# Patient Record
Sex: Female | Born: 1935 | Race: Black or African American | Hispanic: No | Marital: Single | Smoking: Never smoker
Health system: Southern US, Community
[De-identification: ages and names within clinical notes are randomized; demographics above are authoritative.]

## PROBLEM LIST (undated history)

## (undated) DIAGNOSIS — E119 Type 2 diabetes mellitus without complications: Secondary | ICD-10-CM

## (undated) DIAGNOSIS — K5909 Other constipation: Secondary | ICD-10-CM

## (undated) DIAGNOSIS — I509 Heart failure, unspecified: Secondary | ICD-10-CM

## (undated) DIAGNOSIS — M1990 Unspecified osteoarthritis, unspecified site: Secondary | ICD-10-CM

## (undated) DIAGNOSIS — E785 Hyperlipidemia, unspecified: Secondary | ICD-10-CM

## (undated) DIAGNOSIS — Z853 Personal history of malignant neoplasm of breast: Secondary | ICD-10-CM

## (undated) DIAGNOSIS — I10 Essential (primary) hypertension: Secondary | ICD-10-CM

## (undated) DIAGNOSIS — I480 Paroxysmal atrial fibrillation: Secondary | ICD-10-CM

## (undated) DIAGNOSIS — T7840XA Allergy, unspecified, initial encounter: Secondary | ICD-10-CM

## (undated) DIAGNOSIS — I442 Atrioventricular block, complete: Secondary | ICD-10-CM

## (undated) HISTORY — DX: Paroxysmal atrial fibrillation: I48.0

## (undated) HISTORY — DX: Essential (primary) hypertension: I10

## (undated) HISTORY — DX: Type 2 diabetes mellitus without complications: E11.9

## (undated) HISTORY — DX: Heart failure, unspecified: I50.9

## (undated) HISTORY — DX: Other constipation: K59.09

## (undated) HISTORY — PX: BREAST SURGERY: SHX581

## (undated) HISTORY — DX: Unspecified osteoarthritis, unspecified site: M19.90

## (undated) HISTORY — DX: Hyperlipidemia, unspecified: E78.5

## (undated) HISTORY — DX: Allergy, unspecified, initial encounter: T78.40XA

## (undated) HISTORY — DX: Personal history of malignant neoplasm of breast: Z85.3

---

## 2011-10-15 DIAGNOSIS — M2010 Hallux valgus (acquired), unspecified foot: Secondary | ICD-10-CM | POA: Diagnosis not present

## 2011-10-15 DIAGNOSIS — M79609 Pain in unspecified limb: Secondary | ICD-10-CM | POA: Diagnosis not present

## 2011-10-15 DIAGNOSIS — M2040 Other hammer toe(s) (acquired), unspecified foot: Secondary | ICD-10-CM | POA: Diagnosis not present

## 2011-10-29 DIAGNOSIS — E1159 Type 2 diabetes mellitus with other circulatory complications: Secondary | ICD-10-CM | POA: Diagnosis not present

## 2011-10-29 DIAGNOSIS — B351 Tinea unguium: Secondary | ICD-10-CM | POA: Diagnosis not present

## 2011-10-29 DIAGNOSIS — M2040 Other hammer toe(s) (acquired), unspecified foot: Secondary | ICD-10-CM | POA: Diagnosis not present

## 2011-10-29 DIAGNOSIS — L84 Corns and callosities: Secondary | ICD-10-CM | POA: Diagnosis not present

## 2011-12-19 DIAGNOSIS — N182 Chronic kidney disease, stage 2 (mild): Secondary | ICD-10-CM | POA: Diagnosis not present

## 2011-12-19 DIAGNOSIS — N058 Unspecified nephritic syndrome with other morphologic changes: Secondary | ICD-10-CM | POA: Diagnosis not present

## 2011-12-19 DIAGNOSIS — E1129 Type 2 diabetes mellitus with other diabetic kidney complication: Secondary | ICD-10-CM | POA: Diagnosis not present

## 2011-12-19 DIAGNOSIS — E1165 Type 2 diabetes mellitus with hyperglycemia: Secondary | ICD-10-CM | POA: Diagnosis not present

## 2011-12-25 DIAGNOSIS — I10 Essential (primary) hypertension: Secondary | ICD-10-CM | POA: Diagnosis not present

## 2011-12-27 DIAGNOSIS — H04129 Dry eye syndrome of unspecified lacrimal gland: Secondary | ICD-10-CM | POA: Diagnosis not present

## 2011-12-27 DIAGNOSIS — H01029 Squamous blepharitis unspecified eye, unspecified eyelid: Secondary | ICD-10-CM | POA: Diagnosis not present

## 2012-01-18 DIAGNOSIS — H6120 Impacted cerumen, unspecified ear: Secondary | ICD-10-CM | POA: Diagnosis not present

## 2012-01-18 DIAGNOSIS — J310 Chronic rhinitis: Secondary | ICD-10-CM | POA: Diagnosis not present

## 2012-02-27 DIAGNOSIS — B351 Tinea unguium: Secondary | ICD-10-CM | POA: Diagnosis not present

## 2012-02-27 DIAGNOSIS — E1159 Type 2 diabetes mellitus with other circulatory complications: Secondary | ICD-10-CM | POA: Diagnosis not present

## 2012-02-27 DIAGNOSIS — L84 Corns and callosities: Secondary | ICD-10-CM | POA: Diagnosis not present

## 2012-02-27 DIAGNOSIS — M2040 Other hammer toe(s) (acquired), unspecified foot: Secondary | ICD-10-CM | POA: Diagnosis not present

## 2012-05-27 DIAGNOSIS — H538 Other visual disturbances: Secondary | ICD-10-CM | POA: Diagnosis not present

## 2012-05-27 DIAGNOSIS — H2510 Age-related nuclear cataract, unspecified eye: Secondary | ICD-10-CM | POA: Diagnosis not present

## 2012-05-27 DIAGNOSIS — H04129 Dry eye syndrome of unspecified lacrimal gland: Secondary | ICD-10-CM | POA: Diagnosis not present

## 2012-05-27 DIAGNOSIS — H52229 Regular astigmatism, unspecified eye: Secondary | ICD-10-CM | POA: Diagnosis not present

## 2012-05-27 DIAGNOSIS — E119 Type 2 diabetes mellitus without complications: Secondary | ICD-10-CM | POA: Diagnosis not present

## 2012-06-13 DIAGNOSIS — M1990 Unspecified osteoarthritis, unspecified site: Secondary | ICD-10-CM | POA: Diagnosis not present

## 2012-06-13 DIAGNOSIS — J40 Bronchitis, not specified as acute or chronic: Secondary | ICD-10-CM | POA: Diagnosis not present

## 2012-06-13 DIAGNOSIS — Z79899 Other long term (current) drug therapy: Secondary | ICD-10-CM | POA: Diagnosis not present

## 2012-06-13 DIAGNOSIS — E7800 Pure hypercholesterolemia, unspecified: Secondary | ICD-10-CM | POA: Diagnosis not present

## 2012-06-13 DIAGNOSIS — I10 Essential (primary) hypertension: Secondary | ICD-10-CM | POA: Diagnosis not present

## 2012-06-25 DIAGNOSIS — M2010 Hallux valgus (acquired), unspecified foot: Secondary | ICD-10-CM | POA: Diagnosis not present

## 2012-06-25 DIAGNOSIS — L608 Other nail disorders: Secondary | ICD-10-CM | POA: Diagnosis not present

## 2012-06-25 DIAGNOSIS — L84 Corns and callosities: Secondary | ICD-10-CM | POA: Diagnosis not present

## 2012-06-25 DIAGNOSIS — E119 Type 2 diabetes mellitus without complications: Secondary | ICD-10-CM | POA: Diagnosis not present

## 2012-06-27 DIAGNOSIS — M2550 Pain in unspecified joint: Secondary | ICD-10-CM | POA: Diagnosis not present

## 2012-06-27 DIAGNOSIS — Z23 Encounter for immunization: Secondary | ICD-10-CM | POA: Diagnosis not present

## 2012-07-01 ENCOUNTER — Other Ambulatory Visit: Payer: Self-pay | Admitting: Family Medicine

## 2012-07-01 ENCOUNTER — Ambulatory Visit
Admission: RE | Admit: 2012-07-01 | Discharge: 2012-07-01 | Disposition: A | Discharge status: Home / Self Care | Payer: Medicare Other | Source: Ambulatory Visit | Attending: Family Medicine | Admitting: Family Medicine

## 2012-07-01 DIAGNOSIS — R52 Pain, unspecified: Secondary | ICD-10-CM

## 2012-07-01 DIAGNOSIS — M19049 Primary osteoarthritis, unspecified hand: Secondary | ICD-10-CM | POA: Diagnosis not present

## 2012-07-01 DIAGNOSIS — M2560 Stiffness of unspecified joint, not elsewhere classified: Secondary | ICD-10-CM

## 2012-07-02 DIAGNOSIS — M129 Arthropathy, unspecified: Secondary | ICD-10-CM | POA: Diagnosis not present

## 2012-08-29 DIAGNOSIS — L84 Corns and callosities: Secondary | ICD-10-CM | POA: Diagnosis not present

## 2012-08-29 DIAGNOSIS — E119 Type 2 diabetes mellitus without complications: Secondary | ICD-10-CM | POA: Diagnosis not present

## 2012-08-29 DIAGNOSIS — L608 Other nail disorders: Secondary | ICD-10-CM | POA: Diagnosis not present

## 2012-10-13 ENCOUNTER — Other Ambulatory Visit: Payer: Self-pay | Admitting: Obstetrics and Gynecology

## 2012-10-13 ENCOUNTER — Other Ambulatory Visit (HOSPITAL_COMMUNITY)
Admission: RE | Admit: 2012-10-13 | Discharge: 2012-10-13 | Disposition: A | Discharge status: Home / Self Care | Payer: Medicare Other | Source: Ambulatory Visit | Attending: Obstetrics and Gynecology | Admitting: Obstetrics and Gynecology

## 2012-10-13 DIAGNOSIS — R109 Unspecified abdominal pain: Secondary | ICD-10-CM | POA: Diagnosis not present

## 2012-10-13 DIAGNOSIS — Z01419 Encounter for gynecological examination (general) (routine) without abnormal findings: Secondary | ICD-10-CM | POA: Insufficient documentation

## 2012-10-13 DIAGNOSIS — R2230 Localized swelling, mass and lump, unspecified upper limb: Secondary | ICD-10-CM

## 2012-10-13 DIAGNOSIS — N951 Menopausal and female climacteric states: Secondary | ICD-10-CM | POA: Diagnosis not present

## 2012-10-13 DIAGNOSIS — C50919 Malignant neoplasm of unspecified site of unspecified female breast: Secondary | ICD-10-CM | POA: Diagnosis not present

## 2012-10-13 DIAGNOSIS — N898 Other specified noninflammatory disorders of vagina: Secondary | ICD-10-CM | POA: Diagnosis not present

## 2012-10-13 DIAGNOSIS — Z9012 Acquired absence of left breast and nipple: Secondary | ICD-10-CM

## 2012-10-13 DIAGNOSIS — Z129 Encounter for screening for malignant neoplasm, site unspecified: Secondary | ICD-10-CM | POA: Diagnosis not present

## 2012-10-13 DIAGNOSIS — Z1151 Encounter for screening for human papillomavirus (HPV): Secondary | ICD-10-CM | POA: Diagnosis not present

## 2012-10-13 DIAGNOSIS — M79609 Pain in unspecified limb: Secondary | ICD-10-CM | POA: Diagnosis not present

## 2012-10-13 DIAGNOSIS — Z853 Personal history of malignant neoplasm of breast: Secondary | ICD-10-CM

## 2012-10-17 ENCOUNTER — Telehealth: Payer: Self-pay | Admitting: *Deleted

## 2012-10-17 NOTE — Telephone Encounter (Signed)
Left message for pt to return my call so I can schedule a med onc appt. 

## 2012-10-17 NOTE — Telephone Encounter (Signed)
Pt returned my call and did not understand why I was called her and I informed her and then she was not aware of her appts scheduled at [facility].  She stated that she was going to call them and possibly reschedule and then call Dr. Carey Bullocks office and find out more information and then she would call me back to schedule an appt.

## 2012-10-27 ENCOUNTER — Telehealth: Payer: Self-pay | Admitting: *Deleted

## 2012-10-27 NOTE — Telephone Encounter (Signed)
Confirmed 11/20/12 appt w/ pt.  Mailed before appt letter & packet to pt.  Called Myrine at referring to make aware.  Took paperwork to Med Rec for chart.

## 2012-10-28 ENCOUNTER — Other Ambulatory Visit: Payer: Self-pay | Admitting: *Deleted

## 2012-10-28 DIAGNOSIS — Z853 Personal history of malignant neoplasm of breast: Secondary | ICD-10-CM | POA: Insufficient documentation

## 2012-11-03 ENCOUNTER — Other Ambulatory Visit: Payer: Self-pay | Admitting: Obstetrics and Gynecology

## 2012-11-03 ENCOUNTER — Ambulatory Visit
Admission: RE | Admit: 2012-11-03 | Discharge: 2012-11-03 | Disposition: A | Discharge status: Home / Self Care | Payer: Medicare Other | Source: Ambulatory Visit | Attending: Obstetrics and Gynecology | Admitting: Obstetrics and Gynecology

## 2012-11-03 ENCOUNTER — Other Ambulatory Visit: Payer: Medicare Other

## 2012-11-03 DIAGNOSIS — R2230 Localized swelling, mass and lump, unspecified upper limb: Secondary | ICD-10-CM

## 2012-11-03 DIAGNOSIS — N6310 Unspecified lump in the right breast, unspecified quadrant: Secondary | ICD-10-CM

## 2012-11-03 DIAGNOSIS — Z9012 Acquired absence of left breast and nipple: Secondary | ICD-10-CM

## 2012-11-03 DIAGNOSIS — Z853 Personal history of malignant neoplasm of breast: Secondary | ICD-10-CM | POA: Diagnosis not present

## 2012-11-04 ENCOUNTER — Other Ambulatory Visit: Payer: Self-pay | Admitting: Gastroenterology

## 2012-11-04 DIAGNOSIS — Z139 Encounter for screening, unspecified: Secondary | ICD-10-CM

## 2012-11-18 ENCOUNTER — Other Ambulatory Visit: Payer: Medicare Other

## 2012-11-20 ENCOUNTER — Ambulatory Visit: Payer: Medicare Other | Admitting: Oncology

## 2012-11-20 ENCOUNTER — Ambulatory Visit: Payer: Medicare Other

## 2012-11-20 ENCOUNTER — Other Ambulatory Visit: Payer: Medicare Other | Admitting: Lab

## 2012-11-24 ENCOUNTER — Telehealth: Payer: Self-pay | Admitting: *Deleted

## 2012-11-24 NOTE — Telephone Encounter (Signed)
Called patient about her missed appt and she was confused on why she was coming here.  Told her that she has a history of breast cancer and Dr. Idamae Schuller has requested for her to come and f/u with a Medical Oncologist.   She stated that she was going to call Dr. Carey Bullocks office and speak with them about this because she was not having any problems and she does not feel the need to come.  Gave her Dr. Carey Bullocks number and my name and number and requested that if she wanted to be seen to please call me back and I would reschedule her.

## 2012-12-24 ENCOUNTER — Ambulatory Visit
Admission: RE | Admit: 2012-12-24 | Discharge: 2012-12-24 | Disposition: A | Discharge status: Home / Self Care | Payer: Medicare Other | Source: Ambulatory Visit | Attending: Family Medicine | Admitting: Family Medicine

## 2012-12-24 ENCOUNTER — Ambulatory Visit (INDEPENDENT_AMBULATORY_CARE_PROVIDER_SITE_OTHER): Payer: Medicare Other | Admitting: Family Medicine

## 2012-12-24 VITALS — BP 114/62 | HR 68 | Temp 98.1°F | Resp 17 | Ht 66.0 in | Wt 166.0 lb

## 2012-12-24 DIAGNOSIS — IMO0002 Reserved for concepts with insufficient information to code with codable children: Secondary | ICD-10-CM

## 2012-12-24 DIAGNOSIS — S0093XA Contusion of unspecified part of head, initial encounter: Secondary | ICD-10-CM

## 2012-12-24 DIAGNOSIS — S0003XA Contusion of scalp, initial encounter: Secondary | ICD-10-CM | POA: Diagnosis not present

## 2012-12-24 DIAGNOSIS — M792 Neuralgia and neuritis, unspecified: Secondary | ICD-10-CM

## 2012-12-24 DIAGNOSIS — S1093XA Contusion of unspecified part of neck, initial encounter: Secondary | ICD-10-CM | POA: Diagnosis not present

## 2012-12-24 DIAGNOSIS — M531 Cervicobrachial syndrome: Secondary | ICD-10-CM | POA: Diagnosis not present

## 2012-12-24 DIAGNOSIS — M5481 Occipital neuralgia: Secondary | ICD-10-CM

## 2012-12-24 DIAGNOSIS — IMO0001 Reserved for inherently not codable concepts without codable children: Secondary | ICD-10-CM

## 2012-12-24 DIAGNOSIS — S0083XA Contusion of other part of head, initial encounter: Secondary | ICD-10-CM | POA: Diagnosis not present

## 2012-12-24 LAB — COMPREHENSIVE METABOLIC PANEL
Albumin: 4.4 g/dL (ref 3.5–5.2)
BUN: 30 mg/dL — ABNORMAL HIGH (ref 6–23)
CO2: 24 mEq/L (ref 19–32)
Calcium: 9.9 mg/dL (ref 8.4–10.5)
Chloride: 103 mEq/L (ref 96–112)
Creat: 1.11 mg/dL — ABNORMAL HIGH (ref 0.50–1.10)
Glucose, Bld: 128 mg/dL — ABNORMAL HIGH (ref 70–99)
Potassium: 3.8 mEq/L (ref 3.5–5.3)

## 2012-12-24 LAB — POCT CBC
Hemoglobin: 11.9 g/dL — AB (ref 12.2–16.2)
Lymph, poc: 1.6 (ref 0.6–3.4)
MCH, POC: 29.9 pg (ref 27–31.2)
MCHC: 30.9 g/dL — AB (ref 31.8–35.4)
MCV: 96.7 fL (ref 80–97)
RBC: 3.98 M/uL — AB (ref 4.04–5.48)
WBC: 4.8 10*3/uL (ref 4.6–10.2)
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
## 2012-12-24 NOTE — Patient Instructions (Addendum)
Neuralgia - Plan: POCT CBC, Comprehensive metabolic panel, CT Head Wo Contrast  Type II or unspecified type diabetes mellitus without mention of complication, uncontrolled - Plan: POCT CBC, POCT glucose (manual entry), Comprehensive metabolic panel  Occipital neuralgia - Plan: CT Head Wo Contrast  Contusion of head, initial encounter - Plan: CT Head Wo Contrast    1.  YOU ARE SCHEDULED FOR A CT OF HEAD THIS AFTERNOON.  ([facility]  [address])   Occipital Neuralgia Neuralgias are attacks of sharp stabbing pain. They may be intermittent (comes and goes) or constant in nature. They may be brief attacks that last seconds to minutes and may come back for days to weeks. The neuralgias can occur as a result of a herpes zoster (shingles), chickenpox infection, or even following a herpes simplex infection (cold sore). TYPES OF NEURALGIA  When these pains are located in the back of the head and neck they are called occipital neuralgias.  When the pain is located between ribs it is called intercostal neuralgia.  When the pain is located in the face it is called trigeminal neuralgia. This is the most common neuralgia. It causes sharp, shock like pain on one side of your face. The neuralgias, which follow herpes zoster infections, often produce a constant burning pain. They may last from weeks to months and even years. The attacks of pain may come from injury or inflammation (irritation) to a nerve. Often the cause is unknown. The episodes of pain may be caused by light touch, movement, or even eating and sneezing. Usually these neuralgias occur after age 59. The neuralgias following shingles and trigeminal neuralgia are the most common. Although painful, these episodes do not threaten life and tend to lessen as we grow older. TREATMENT  There are many medications that may be helpful in the treatment of this disorder. Sometimes several medications may have to be tried before the right  combination can be found for you. Some of these medications are:  Only take over-the-counter or prescription medications for pain, discomfort, or fever as directed by your caregiver.  Narcotic medications may be used to control the pain.  Antidepressants and medications used in epilepsy (seizure disorders) may be useful. LET YOUR CAREGIVER KNOW ABOUT:  If you do not obtain relief from medications.  Problems that are getting worse rather than better.  Troubling side effects that you think are coming from the medication. Do not be discouraged if you do not obtain instant relief from the medications or help given you. Your caregiver can help you get through these episodes of pain with some persistence (continued trying) on your part also. Document Released: 09/18/2001 Document Revised: 12/17/2011 Document Reviewed: 09/24/2005 [facility] Patient Information 2013 [place], [place].

## 2013-02-23 ENCOUNTER — Other Ambulatory Visit: Payer: Self-pay | Admitting: Oncology

## 2013-03-03 DIAGNOSIS — E119 Type 2 diabetes mellitus without complications: Secondary | ICD-10-CM | POA: Diagnosis not present

## 2013-03-03 DIAGNOSIS — L608 Other nail disorders: Secondary | ICD-10-CM | POA: Diagnosis not present

## 2013-03-03 DIAGNOSIS — L84 Corns and callosities: Secondary | ICD-10-CM | POA: Diagnosis not present

## 2013-03-03 DIAGNOSIS — L03039 Cellulitis of unspecified toe: Secondary | ICD-10-CM | POA: Diagnosis not present

## 2013-03-16 ENCOUNTER — Other Ambulatory Visit: Payer: Self-pay | Admitting: Gastroenterology

## 2013-03-16 ENCOUNTER — Ambulatory Visit
Admission: RE | Admit: 2013-03-16 | Discharge: 2013-03-16 | Disposition: A | Discharge status: Home / Self Care | Payer: Medicare Other | Source: Ambulatory Visit | Attending: Gastroenterology | Admitting: Gastroenterology

## 2013-03-16 DIAGNOSIS — Z139 Encounter for screening, unspecified: Secondary | ICD-10-CM

## 2013-03-16 DIAGNOSIS — K921 Melena: Secondary | ICD-10-CM | POA: Diagnosis not present

## 2013-03-18 DIAGNOSIS — E559 Vitamin D deficiency, unspecified: Secondary | ICD-10-CM | POA: Diagnosis not present

## 2013-03-18 DIAGNOSIS — E119 Type 2 diabetes mellitus without complications: Secondary | ICD-10-CM | POA: Diagnosis not present

## 2013-03-18 DIAGNOSIS — M545 Low back pain: Secondary | ICD-10-CM | POA: Diagnosis not present

## 2013-03-18 DIAGNOSIS — M25569 Pain in unspecified knee: Secondary | ICD-10-CM | POA: Diagnosis not present

## 2013-03-18 DIAGNOSIS — Z79899 Other long term (current) drug therapy: Secondary | ICD-10-CM | POA: Diagnosis not present

## 2013-04-09 ENCOUNTER — Other Ambulatory Visit: Payer: Self-pay | Admitting: Obstetrics and Gynecology

## 2013-04-09 DIAGNOSIS — N6310 Unspecified lump in the right breast, unspecified quadrant: Secondary | ICD-10-CM

## 2013-04-09 DIAGNOSIS — Z9012 Acquired absence of left breast and nipple: Secondary | ICD-10-CM

## 2013-04-09 DIAGNOSIS — Z853 Personal history of malignant neoplasm of breast: Secondary | ICD-10-CM

## 2013-05-13 ENCOUNTER — Other Ambulatory Visit: Payer: Self-pay | Admitting: Obstetrics and Gynecology

## 2013-05-13 ENCOUNTER — Ambulatory Visit
Admission: RE | Admit: 2013-05-13 | Discharge: 2013-05-13 | Disposition: A | Discharge status: Home / Self Care | Payer: Medicare Other | Source: Ambulatory Visit | Attending: Obstetrics and Gynecology | Admitting: Obstetrics and Gynecology

## 2013-05-13 DIAGNOSIS — Z853 Personal history of malignant neoplasm of breast: Secondary | ICD-10-CM

## 2013-05-13 DIAGNOSIS — Z9012 Acquired absence of left breast and nipple: Secondary | ICD-10-CM

## 2013-05-13 DIAGNOSIS — N6310 Unspecified lump in the right breast, unspecified quadrant: Secondary | ICD-10-CM

## 2013-05-13 DIAGNOSIS — N6489 Other specified disorders of breast: Secondary | ICD-10-CM | POA: Diagnosis not present

## 2013-06-19 DIAGNOSIS — Z79899 Other long term (current) drug therapy: Secondary | ICD-10-CM | POA: Diagnosis not present

## 2013-06-19 DIAGNOSIS — E7800 Pure hypercholesterolemia, unspecified: Secondary | ICD-10-CM | POA: Diagnosis not present

## 2013-06-19 DIAGNOSIS — L84 Corns and callosities: Secondary | ICD-10-CM | POA: Diagnosis not present

## 2013-06-19 DIAGNOSIS — E559 Vitamin D deficiency, unspecified: Secondary | ICD-10-CM | POA: Diagnosis not present

## 2013-06-19 DIAGNOSIS — Z23 Encounter for immunization: Secondary | ICD-10-CM | POA: Diagnosis not present

## 2013-06-19 DIAGNOSIS — M1990 Unspecified osteoarthritis, unspecified site: Secondary | ICD-10-CM | POA: Diagnosis not present

## 2013-06-19 DIAGNOSIS — N63 Unspecified lump in unspecified breast: Secondary | ICD-10-CM | POA: Diagnosis not present

## 2013-06-19 DIAGNOSIS — L608 Other nail disorders: Secondary | ICD-10-CM | POA: Diagnosis not present

## 2013-06-19 DIAGNOSIS — N189 Chronic kidney disease, unspecified: Secondary | ICD-10-CM | POA: Diagnosis not present

## 2013-06-19 DIAGNOSIS — E119 Type 2 diabetes mellitus without complications: Secondary | ICD-10-CM | POA: Diagnosis not present

## 2013-06-24 ENCOUNTER — Telehealth: Payer: Self-pay | Admitting: *Deleted

## 2013-06-24 NOTE — Telephone Encounter (Signed)
Confirmed 07/23/13 appt w/ pt.  Mailed before appt letter & packet to pt.  Called referring to make them aware.  Took paperwork to Med Rec for chart.

## 2013-07-23 ENCOUNTER — Ambulatory Visit (HOSPITAL_BASED_OUTPATIENT_CLINIC_OR_DEPARTMENT_OTHER): Payer: Medicare Other | Admitting: Oncology

## 2013-07-23 ENCOUNTER — Other Ambulatory Visit (HOSPITAL_BASED_OUTPATIENT_CLINIC_OR_DEPARTMENT_OTHER): Payer: Medicare Other | Admitting: Lab

## 2013-07-23 ENCOUNTER — Other Ambulatory Visit: Payer: Self-pay | Admitting: *Deleted

## 2013-07-23 ENCOUNTER — Ambulatory Visit: Payer: Medicare Other

## 2013-07-23 VITALS — BP 166/64 | HR 76 | Temp 98.2°F | Resp 20 | Ht 66.0 in | Wt 161.8 lb

## 2013-07-23 DIAGNOSIS — C50912 Malignant neoplasm of unspecified site of left female breast: Secondary | ICD-10-CM

## 2013-07-23 DIAGNOSIS — E119 Type 2 diabetes mellitus without complications: Secondary | ICD-10-CM

## 2013-07-23 DIAGNOSIS — I10 Essential (primary) hypertension: Secondary | ICD-10-CM | POA: Diagnosis not present

## 2013-07-23 DIAGNOSIS — C50919 Malignant neoplasm of unspecified site of unspecified female breast: Secondary | ICD-10-CM

## 2013-07-23 DIAGNOSIS — Z853 Personal history of malignant neoplasm of breast: Secondary | ICD-10-CM

## 2013-07-23 DIAGNOSIS — R300 Dysuria: Secondary | ICD-10-CM | POA: Insufficient documentation

## 2013-07-23 DIAGNOSIS — N63 Unspecified lump in unspecified breast: Secondary | ICD-10-CM | POA: Diagnosis not present

## 2013-07-23 LAB — CBC WITH DIFFERENTIAL/PLATELET
Basophils Absolute: 0.1 10*3/uL (ref 0.0–0.1)
Eosinophils Absolute: 0.1 10*3/uL (ref 0.0–0.5)
HGB: 12.4 g/dL (ref 11.6–15.9)
LYMPH%: 28.5 % (ref 14.0–49.7)
MCV: 93.8 fL (ref 79.5–101.0)
MONO#: 0.4 10*3/uL (ref 0.1–0.9)
MONO%: 6.1 % (ref 0.0–14.0)
NEUT#: 3.6 10*3/uL (ref 1.5–6.5)
Platelets: 184 10*3/uL (ref 145–400)

## 2013-07-23 LAB — URINALYSIS, MICROSCOPIC - CHCC
Blood: NEGATIVE
Glucose: NEGATIVE mg/dL
Ketones: NEGATIVE mg/dL
pH: 6.5 (ref 4.6–8.0)

## 2013-07-23 LAB — COMPREHENSIVE METABOLIC PANEL (CC13)
Albumin: 3.6 g/dL (ref 3.5–5.0)
Alkaline Phosphatase: 96 U/L (ref 40–150)
Anion Gap: 8 mEq/L (ref 3–11)
BUN: 20.5 mg/dL (ref 7.0–26.0)
CO2: 28 mEq/L (ref 22–29)
Glucose: 139 mg/dl (ref 70–140)
Potassium: 4.2 mEq/L (ref 3.5–5.1)
Total Bilirubin: 0.36 mg/dL (ref 0.20–1.20)
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
## 2013-07-24 ENCOUNTER — Telehealth: Payer: Self-pay | Admitting: Oncology

## 2013-07-24 ENCOUNTER — Telehealth: Payer: Self-pay | Admitting: *Deleted

## 2013-07-24 LAB — CANCER ANTIGEN 27.29: CA 27.29: 11 U/mL (ref 0–39)

## 2013-07-24 NOTE — Telephone Encounter (Signed)
S/w the pt and she is aware of her march 2015 appts. °

## 2013-07-25 LAB — URINE CULTURE

## 2013-08-03 ENCOUNTER — Other Ambulatory Visit: Payer: Self-pay | Admitting: *Deleted

## 2013-08-03 MED ORDER — METRONIDAZOLE 1 % EX GEL: Freq: Every day | CUTANEOUS | Status: DC

## 2013-08-03 NOTE — Progress Notes (Signed)
Patient has not heard about the Rx of cream that was to be called in for facial rash. Spoke with Dr Darnelle Catalan, he intended for patient to have Metrogel daily until rash subsides.

## 2013-08-04 ENCOUNTER — Other Ambulatory Visit: Payer: Self-pay | Admitting: *Deleted

## 2013-08-04 MED ORDER — METRONIDAZOLE 1 % EX GEL: Freq: Every day | CUTANEOUS | Status: DC

## 2013-08-04 NOTE — Telephone Encounter (Signed)
Pt left message stating prescription for metrogel to be faxed to [phone]. " at [facility] it would cost me $200 but thru my benefit card I can get it for $5 ".  Prescription obtained and faxed.

## 2013-08-18 ENCOUNTER — Encounter: Payer: Self-pay | Admitting: *Deleted

## 2013-08-18 NOTE — Progress Notes (Signed)
Mailed after appt letter to pt. 

## 2013-09-18 DIAGNOSIS — I10 Essential (primary) hypertension: Secondary | ICD-10-CM | POA: Diagnosis not present

## 2013-09-18 DIAGNOSIS — N3000 Acute cystitis without hematuria: Secondary | ICD-10-CM | POA: Diagnosis not present

## 2013-09-18 DIAGNOSIS — Z79899 Other long term (current) drug therapy: Secondary | ICD-10-CM | POA: Diagnosis not present

## 2013-09-18 DIAGNOSIS — E7800 Pure hypercholesterolemia, unspecified: Secondary | ICD-10-CM | POA: Diagnosis not present

## 2013-09-18 DIAGNOSIS — E559 Vitamin D deficiency, unspecified: Secondary | ICD-10-CM | POA: Diagnosis not present

## 2013-09-18 DIAGNOSIS — E119 Type 2 diabetes mellitus without complications: Secondary | ICD-10-CM | POA: Diagnosis not present

## 2013-09-18 DIAGNOSIS — R3989 Other symptoms and signs involving the genitourinary system: Secondary | ICD-10-CM | POA: Diagnosis not present

## 2013-09-21 ENCOUNTER — Other Ambulatory Visit: Payer: Self-pay | Admitting: Family Medicine

## 2013-09-21 DIAGNOSIS — R109 Unspecified abdominal pain: Secondary | ICD-10-CM

## 2013-09-21 DIAGNOSIS — R945 Abnormal results of liver function studies: Secondary | ICD-10-CM

## 2013-09-22 DIAGNOSIS — L02619 Cutaneous abscess of unspecified foot: Secondary | ICD-10-CM | POA: Diagnosis not present

## 2013-09-22 DIAGNOSIS — L97509 Non-pressure chronic ulcer of other part of unspecified foot with unspecified severity: Secondary | ICD-10-CM | POA: Diagnosis not present

## 2013-09-22 DIAGNOSIS — E119 Type 2 diabetes mellitus without complications: Secondary | ICD-10-CM | POA: Diagnosis not present

## 2013-09-24 ENCOUNTER — Ambulatory Visit
Admission: RE | Admit: 2013-09-24 | Discharge: 2013-09-24 | Disposition: A | Discharge status: Home / Self Care | Payer: Medicare Other | Source: Ambulatory Visit | Attending: Family Medicine | Admitting: Family Medicine

## 2013-09-24 DIAGNOSIS — R945 Abnormal results of liver function studies: Secondary | ICD-10-CM

## 2013-09-24 DIAGNOSIS — K8020 Calculus of gallbladder without cholecystitis without obstruction: Secondary | ICD-10-CM | POA: Diagnosis not present

## 2013-09-24 DIAGNOSIS — R109 Unspecified abdominal pain: Secondary | ICD-10-CM

## 2013-09-29 ENCOUNTER — Ambulatory Visit (INDEPENDENT_AMBULATORY_CARE_PROVIDER_SITE_OTHER): Payer: Self-pay | Admitting: General Surgery

## 2013-10-20 ENCOUNTER — Ambulatory Visit (INDEPENDENT_AMBULATORY_CARE_PROVIDER_SITE_OTHER): Payer: Medicare Other | Admitting: General Surgery

## 2013-10-21 ENCOUNTER — Encounter (INDEPENDENT_AMBULATORY_CARE_PROVIDER_SITE_OTHER): Payer: Self-pay | Admitting: General Surgery

## 2013-10-29 ENCOUNTER — Ambulatory Visit (INDEPENDENT_AMBULATORY_CARE_PROVIDER_SITE_OTHER): Payer: Medicare Other | Admitting: General Surgery

## 2013-12-01 ENCOUNTER — Ambulatory Visit (INDEPENDENT_AMBULATORY_CARE_PROVIDER_SITE_OTHER): Payer: Medicare Other | Admitting: General Surgery

## 2013-12-16 ENCOUNTER — Ambulatory Visit: Payer: Medicare Other | Admitting: Cardiology

## 2013-12-22 ENCOUNTER — Ambulatory Visit: Payer: Medicare Other | Admitting: Physician Assistant

## 2014-01-04 ENCOUNTER — Telehealth: Payer: Self-pay | Admitting: *Deleted

## 2014-01-04 NOTE — Telephone Encounter (Signed)
Patient called to cancel appt for 3/31, did not understand what this was for. Explained to patient she was to have a repeat Mammo and then see MD for follow up. Patient states she will r/s mammo and call us back for appt at that time. Gave patient phone number to breast center to schedule this.

## 2014-01-05 ENCOUNTER — Ambulatory Visit: Payer: Medicare Other | Admitting: Physician Assistant

## 2014-01-05 ENCOUNTER — Other Ambulatory Visit: Payer: Medicare Other

## 2014-02-04 DIAGNOSIS — I10 Essential (primary) hypertension: Secondary | ICD-10-CM | POA: Diagnosis not present

## 2014-02-04 DIAGNOSIS — R609 Edema, unspecified: Secondary | ICD-10-CM | POA: Diagnosis not present

## 2014-02-04 DIAGNOSIS — E119 Type 2 diabetes mellitus without complications: Secondary | ICD-10-CM | POA: Diagnosis not present

## 2014-02-04 DIAGNOSIS — M109 Gout, unspecified: Secondary | ICD-10-CM | POA: Diagnosis not present

## 2014-03-04 ENCOUNTER — Other Ambulatory Visit: Payer: Self-pay | Admitting: Family Medicine

## 2014-03-04 ENCOUNTER — Other Ambulatory Visit: Payer: Self-pay | Admitting: Obstetrics and Gynecology

## 2014-03-04 DIAGNOSIS — L84 Corns and callosities: Secondary | ICD-10-CM | POA: Diagnosis not present

## 2014-03-04 DIAGNOSIS — N63 Unspecified lump in unspecified breast: Secondary | ICD-10-CM

## 2014-03-04 DIAGNOSIS — L608 Other nail disorders: Secondary | ICD-10-CM | POA: Diagnosis not present

## 2014-03-04 DIAGNOSIS — Q6689 Other  specified congenital deformities of feet: Secondary | ICD-10-CM | POA: Diagnosis not present

## 2014-03-04 DIAGNOSIS — E1149 Type 2 diabetes mellitus with other diabetic neurological complication: Secondary | ICD-10-CM | POA: Diagnosis not present

## 2014-03-11 DIAGNOSIS — R609 Edema, unspecified: Secondary | ICD-10-CM | POA: Diagnosis not present

## 2014-03-11 DIAGNOSIS — I10 Essential (primary) hypertension: Secondary | ICD-10-CM | POA: Diagnosis not present

## 2014-03-17 ENCOUNTER — Ambulatory Visit
Admission: RE | Admit: 2014-03-17 | Discharge: 2014-03-17 | Disposition: A | Discharge status: Home / Self Care | Payer: Medicare Other | Source: Ambulatory Visit | Attending: Family Medicine | Admitting: Family Medicine

## 2014-03-17 DIAGNOSIS — R928 Other abnormal and inconclusive findings on diagnostic imaging of breast: Secondary | ICD-10-CM | POA: Diagnosis not present

## 2014-03-17 DIAGNOSIS — N63 Unspecified lump in unspecified breast: Secondary | ICD-10-CM

## 2014-07-28 IMAGING — RF DG BE W/ CM - WO/W KUB
16 of 22 series · 16 of 22 positions shown · non-contrast
Comparison: None.

CLINICAL DATA: Some blood in stool, screening for colorectal
carcinoma

SINGLE COLUMN BARIUM ENEMA
TECHNIQUE: Initial scout AP supine abdominal image obtained to
insure adequate colon cleansing.  Barium was introduced into the
colon in a retrograde fashion and refluxed from the rectum to the
cecum. Spot images of the colon followed by overhead radiographs
were obtained.
Fluoroscopy time: 1-minute-4-second

[Series 1: run · 1 of 1 slices shown (1 of 14)]
[im 1/1]
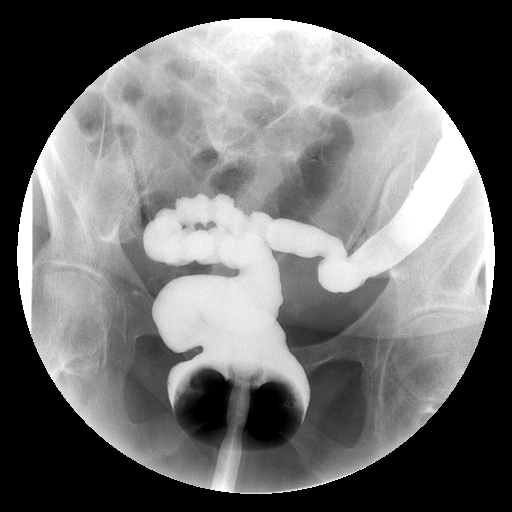

[Series 2: run · 1 of 1 slices shown (2 of 14)]
[im 1/1]
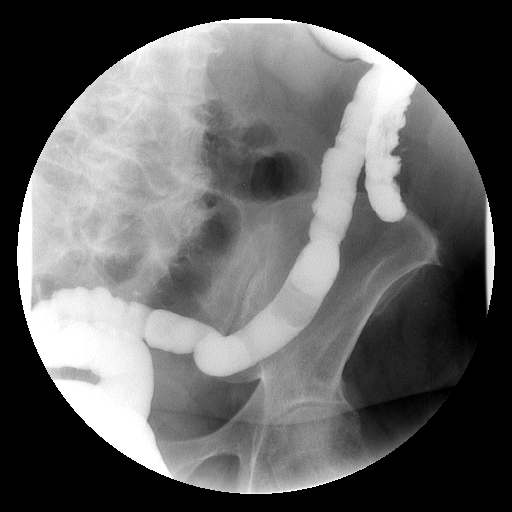

[Series 3: run · 1 of 1 slices shown (3 of 14)]
[im 1/1]
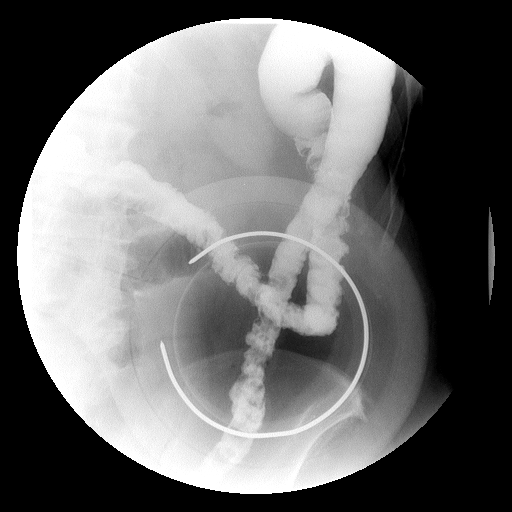

[Series 4: run · 1 of 1 slices shown (4 of 14)]
[im 1/1]
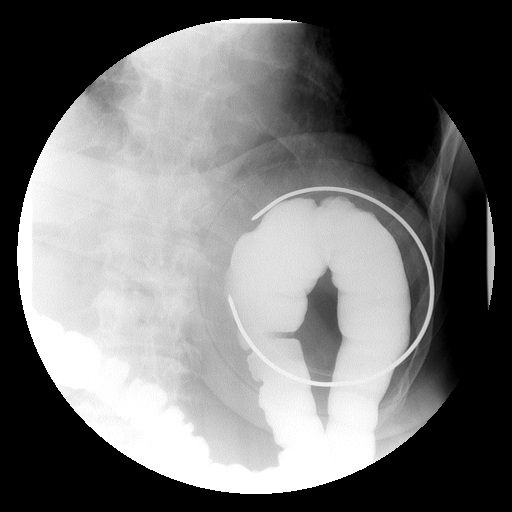

[Series 5: run · 1 of 1 slices shown (5 of 14)]
[im 1/1]
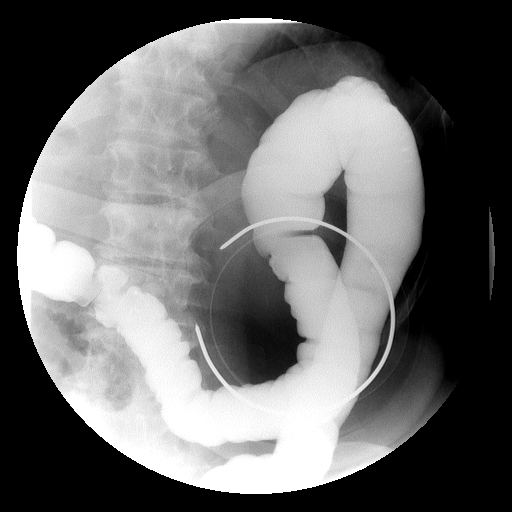

[Series 6: run · 1 of 1 slices shown (6 of 14)]
[im 1/1]
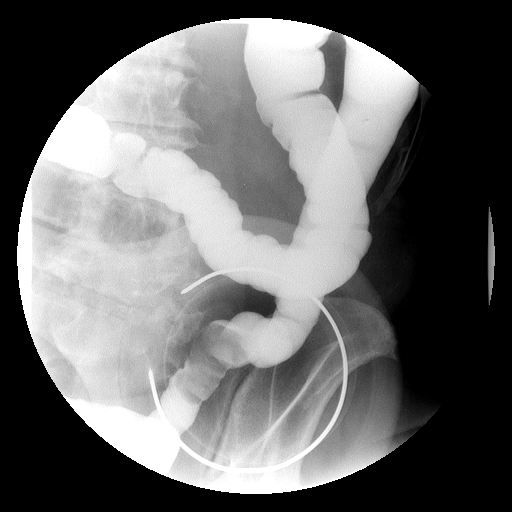

[Series 7: run · 1 of 1 slices shown (7 of 14)]
[im 1/1]
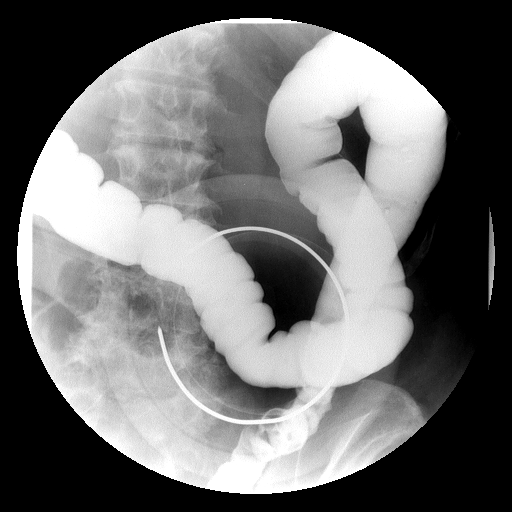

[Series 8: run · 1 of 1 slices shown (8 of 14)]
[im 1/1]
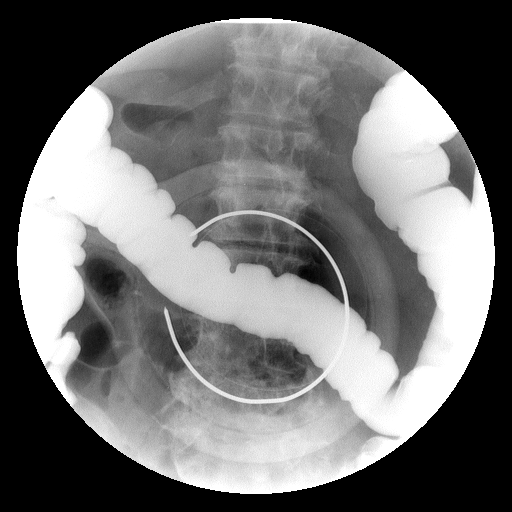

[Series 9: run · 1 of 1 slices shown (9 of 14)]
[im 1/1]
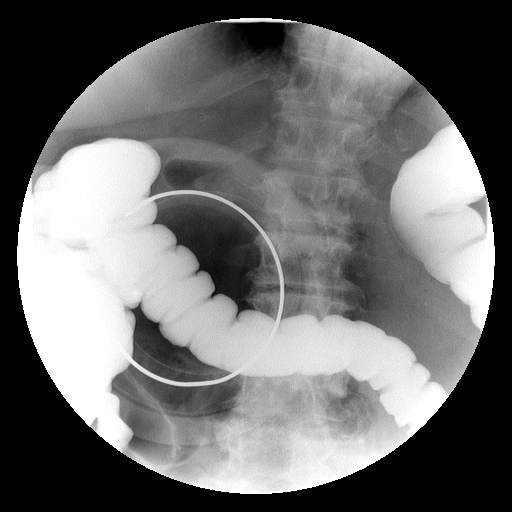

[Series 10: run · 1 of 1 slices shown (10 of 14)]
[im 1/1]
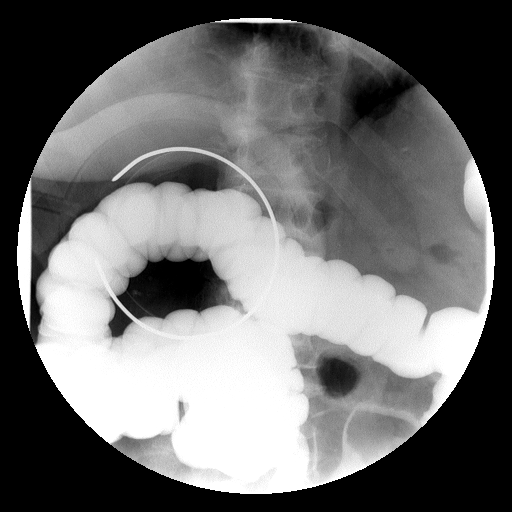

[Series 11: run · 1 of 1 slices shown (11 of 14)]
[im 1/1]
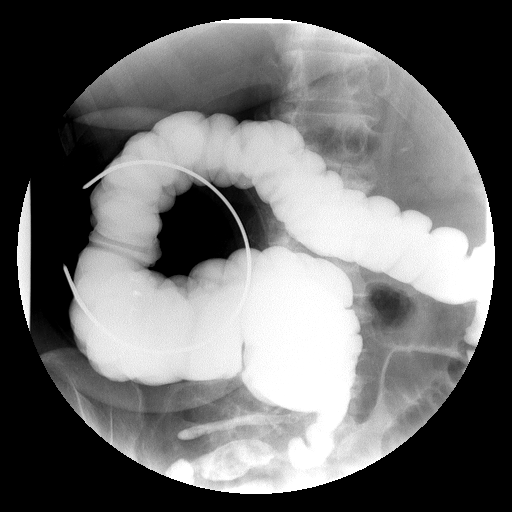

[Series 12: run · 1 of 1 slices shown (12 of 14)]
[im 1/1]
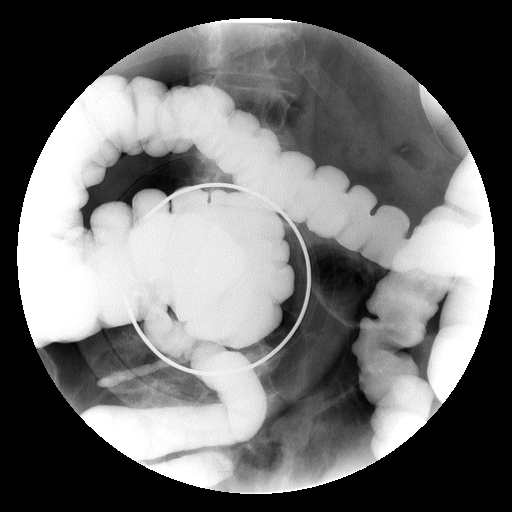

[Series 13: run · 1 of 1 slices shown (13 of 14)]
[im 1/1]
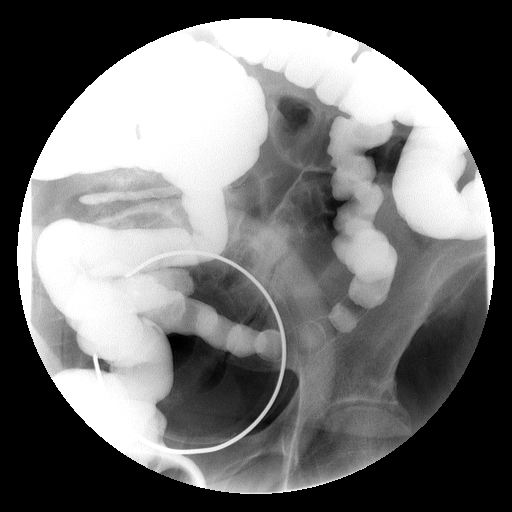

[Series 14: run · 1 of 1 slices shown (14 of 14)]
[im 1/1]
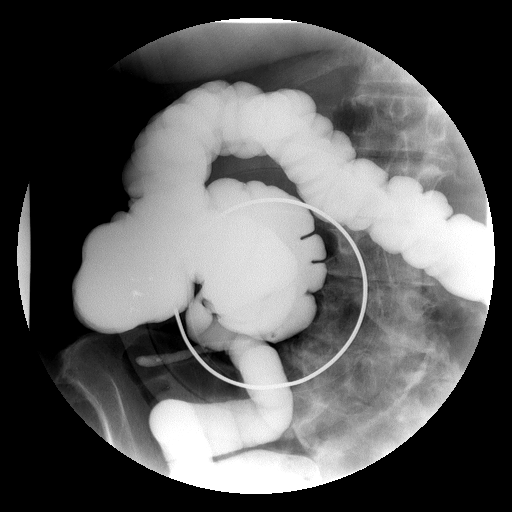

[Series 1001: view not recorded · 0.20mm/px · 1 of 1 slices shown (1 of 2)]
[im 1/1]
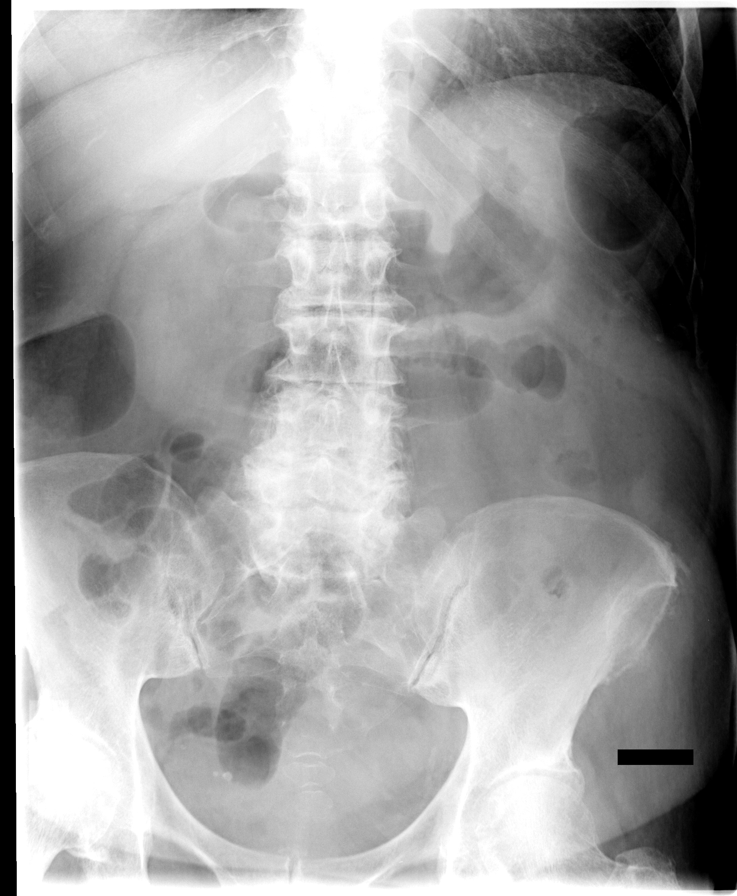

[Series 1002: view not recorded · 0.20mm/px · 1 of 1 slices shown (2 of 2)]
[im 1/1]
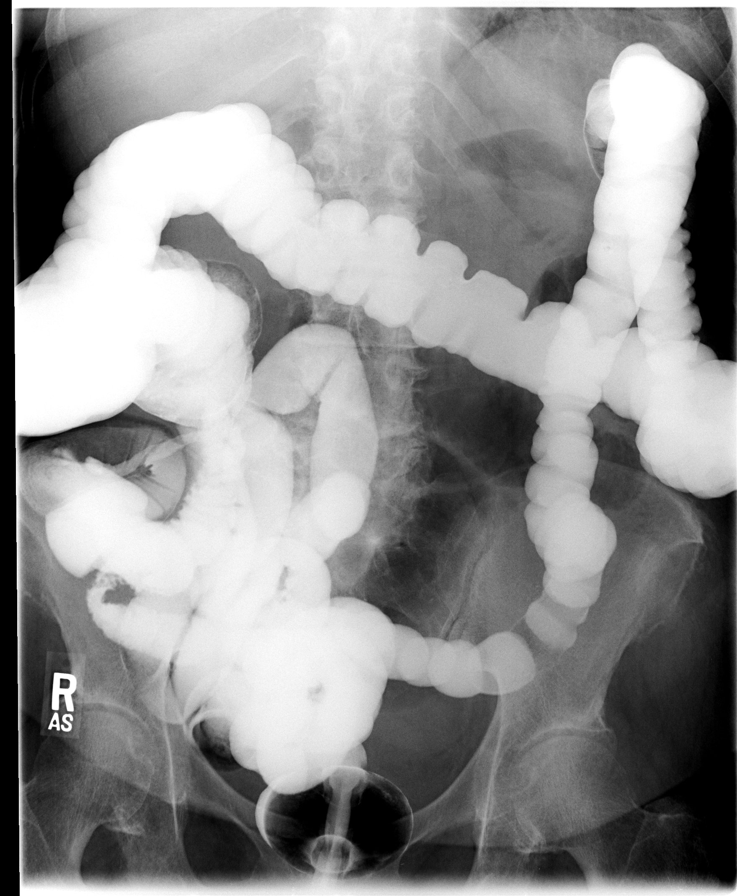

[16 of 22 positions shown; findings below may reference images not displayed]

FINDINGS: A preliminary film of the abdomen shows a nonspecific
bowel gas pattern.  No bowel obstruction is seen.  No opaque
calculi are noted.  There are considerable degenerative changes
within the mid and lower lumbar spine and in both hips.

The patient could not tolerate a double contrast barium enema.
Therefore a single contrast study was performed. Barium entered the
colon without evidence of obstruction or delay. Manual compression
over the entire colon shows no constricting lesion and no definite
persistent polypoid defect.  A small amount  of retained feces is
noted.  The cecum is well seen and there is reflux into the
terminal ileum.  The appendix also appears normal.  On the
postevacuation film, no significant abnormality is noted.
IMPRESSION: Negative single column barium enema.  A small amount of retained
feces does limit evaluation of small polypoid lesions.

## 2014-08-05 DIAGNOSIS — H52223 Regular astigmatism, bilateral: Secondary | ICD-10-CM | POA: Diagnosis not present

## 2014-08-05 DIAGNOSIS — E119 Type 2 diabetes mellitus without complications: Secondary | ICD-10-CM | POA: Diagnosis not present

## 2014-08-05 DIAGNOSIS — H04123 Dry eye syndrome of bilateral lacrimal glands: Secondary | ICD-10-CM | POA: Diagnosis not present

## 2014-08-05 DIAGNOSIS — H538 Other visual disturbances: Secondary | ICD-10-CM | POA: Diagnosis not present

## 2014-08-13 DIAGNOSIS — I10 Essential (primary) hypertension: Secondary | ICD-10-CM | POA: Diagnosis not present

## 2014-08-13 DIAGNOSIS — M1000 Idiopathic gout, unspecified site: Secondary | ICD-10-CM | POA: Diagnosis not present

## 2014-08-13 DIAGNOSIS — L659 Nonscarring hair loss, unspecified: Secondary | ICD-10-CM | POA: Diagnosis not present

## 2014-08-13 DIAGNOSIS — Z23 Encounter for immunization: Secondary | ICD-10-CM | POA: Diagnosis not present

## 2014-08-13 DIAGNOSIS — R609 Edema, unspecified: Secondary | ICD-10-CM | POA: Diagnosis not present

## 2014-08-13 DIAGNOSIS — E119 Type 2 diabetes mellitus without complications: Secondary | ICD-10-CM | POA: Diagnosis not present

## 2014-08-27 DIAGNOSIS — L602 Onychogryphosis: Secondary | ICD-10-CM | POA: Diagnosis not present

## 2014-08-27 DIAGNOSIS — G5762 Lesion of plantar nerve, left lower limb: Secondary | ICD-10-CM | POA: Diagnosis not present

## 2014-08-27 DIAGNOSIS — L84 Corns and callosities: Secondary | ICD-10-CM | POA: Diagnosis not present

## 2014-08-27 DIAGNOSIS — E1151 Type 2 diabetes mellitus with diabetic peripheral angiopathy without gangrene: Secondary | ICD-10-CM | POA: Diagnosis not present

## 2014-09-18 NOTE — Telephone Encounter (Signed)
No note

## 2014-11-12 DIAGNOSIS — E1151 Type 2 diabetes mellitus with diabetic peripheral angiopathy without gangrene: Secondary | ICD-10-CM | POA: Diagnosis not present

## 2014-11-12 DIAGNOSIS — L602 Onychogryphosis: Secondary | ICD-10-CM | POA: Diagnosis not present

## 2014-11-12 DIAGNOSIS — L84 Corns and callosities: Secondary | ICD-10-CM | POA: Diagnosis not present

## 2014-11-12 DIAGNOSIS — S91301A Unspecified open wound, right foot, initial encounter: Secondary | ICD-10-CM | POA: Diagnosis not present

## 2014-12-02 DIAGNOSIS — E119 Type 2 diabetes mellitus without complications: Secondary | ICD-10-CM | POA: Diagnosis not present

## 2014-12-02 DIAGNOSIS — I10 Essential (primary) hypertension: Secondary | ICD-10-CM | POA: Diagnosis not present

## 2014-12-02 DIAGNOSIS — M1711 Unilateral primary osteoarthritis, right knee: Secondary | ICD-10-CM | POA: Diagnosis not present

## 2014-12-02 DIAGNOSIS — M1000 Idiopathic gout, unspecified site: Secondary | ICD-10-CM | POA: Diagnosis not present

## 2014-12-02 DIAGNOSIS — M179 Osteoarthritis of knee, unspecified: Secondary | ICD-10-CM | POA: Diagnosis not present

## 2014-12-29 DIAGNOSIS — E119 Type 2 diabetes mellitus without complications: Secondary | ICD-10-CM | POA: Diagnosis not present

## 2014-12-29 DIAGNOSIS — R609 Edema, unspecified: Secondary | ICD-10-CM | POA: Diagnosis not present

## 2014-12-29 DIAGNOSIS — M1000 Idiopathic gout, unspecified site: Secondary | ICD-10-CM | POA: Diagnosis not present

## 2014-12-29 DIAGNOSIS — I10 Essential (primary) hypertension: Secondary | ICD-10-CM | POA: Diagnosis not present

## 2014-12-29 DIAGNOSIS — E785 Hyperlipidemia, unspecified: Secondary | ICD-10-CM | POA: Diagnosis not present

## 2014-12-30 DIAGNOSIS — I10 Essential (primary) hypertension: Secondary | ICD-10-CM | POA: Diagnosis not present

## 2014-12-30 DIAGNOSIS — M1000 Idiopathic gout, unspecified site: Secondary | ICD-10-CM | POA: Diagnosis not present

## 2014-12-30 DIAGNOSIS — E119 Type 2 diabetes mellitus without complications: Secondary | ICD-10-CM | POA: Diagnosis not present

## 2014-12-30 DIAGNOSIS — H6123 Impacted cerumen, bilateral: Secondary | ICD-10-CM | POA: Diagnosis not present

## 2015-05-04 DIAGNOSIS — L602 Onychogryphosis: Secondary | ICD-10-CM | POA: Diagnosis not present

## 2015-05-04 DIAGNOSIS — E1151 Type 2 diabetes mellitus with diabetic peripheral angiopathy without gangrene: Secondary | ICD-10-CM | POA: Diagnosis not present

## 2015-05-04 DIAGNOSIS — L84 Corns and callosities: Secondary | ICD-10-CM | POA: Diagnosis not present

## 2015-06-01 DIAGNOSIS — E782 Mixed hyperlipidemia: Secondary | ICD-10-CM | POA: Diagnosis not present

## 2015-06-01 DIAGNOSIS — E119 Type 2 diabetes mellitus without complications: Secondary | ICD-10-CM | POA: Diagnosis not present

## 2015-06-01 DIAGNOSIS — I10 Essential (primary) hypertension: Secondary | ICD-10-CM | POA: Diagnosis not present

## 2015-06-01 DIAGNOSIS — K5900 Constipation, unspecified: Secondary | ICD-10-CM | POA: Diagnosis not present

## 2015-07-05 DIAGNOSIS — M19079 Primary osteoarthritis, unspecified ankle and foot: Secondary | ICD-10-CM | POA: Diagnosis not present

## 2015-07-05 DIAGNOSIS — R2241 Localized swelling, mass and lump, right lower limb: Secondary | ICD-10-CM | POA: Diagnosis not present

## 2015-07-05 DIAGNOSIS — L84 Corns and callosities: Secondary | ICD-10-CM | POA: Diagnosis not present

## 2015-07-05 DIAGNOSIS — E1151 Type 2 diabetes mellitus with diabetic peripheral angiopathy without gangrene: Secondary | ICD-10-CM | POA: Diagnosis not present

## 2015-07-05 DIAGNOSIS — L602 Onychogryphosis: Secondary | ICD-10-CM | POA: Diagnosis not present

## 2015-07-08 ENCOUNTER — Other Ambulatory Visit: Payer: Self-pay | Admitting: Family Medicine

## 2015-07-08 DIAGNOSIS — Z9012 Acquired absence of left breast and nipple: Secondary | ICD-10-CM

## 2015-07-08 DIAGNOSIS — N6310 Unspecified lump in the right breast, unspecified quadrant: Secondary | ICD-10-CM

## 2015-07-08 DIAGNOSIS — Z853 Personal history of malignant neoplasm of breast: Secondary | ICD-10-CM

## 2015-07-29 ENCOUNTER — Telehealth: Payer: Self-pay | Admitting: Oncology

## 2015-07-29 NOTE — Telephone Encounter (Signed)
lvm for pt taht 10.27 cx and moved to 11.4 per pof

## 2015-08-02 ENCOUNTER — Ambulatory Visit
Admission: RE | Admit: 2015-08-02 | Discharge: 2015-08-02 | Disposition: A | Discharge status: Home / Self Care | Payer: Medicare Other | Source: Ambulatory Visit | Attending: Family Medicine | Admitting: Family Medicine

## 2015-08-02 DIAGNOSIS — R928 Other abnormal and inconclusive findings on diagnostic imaging of breast: Secondary | ICD-10-CM | POA: Diagnosis not present

## 2015-08-02 DIAGNOSIS — Z9012 Acquired absence of left breast and nipple: Secondary | ICD-10-CM

## 2015-08-02 DIAGNOSIS — Z853 Personal history of malignant neoplasm of breast: Secondary | ICD-10-CM

## 2015-08-02 DIAGNOSIS — N6310 Unspecified lump in the right breast, unspecified quadrant: Secondary | ICD-10-CM

## 2015-08-04 ENCOUNTER — Encounter: Payer: Medicare Other | Admitting: Oncology

## 2015-08-05 DIAGNOSIS — H43811 Vitreous degeneration, right eye: Secondary | ICD-10-CM | POA: Diagnosis not present

## 2015-08-05 DIAGNOSIS — H2511 Age-related nuclear cataract, right eye: Secondary | ICD-10-CM | POA: Diagnosis not present

## 2015-08-05 DIAGNOSIS — E119 Type 2 diabetes mellitus without complications: Secondary | ICD-10-CM | POA: Diagnosis not present

## 2015-08-05 DIAGNOSIS — H52223 Regular astigmatism, bilateral: Secondary | ICD-10-CM | POA: Diagnosis not present

## 2015-08-05 DIAGNOSIS — H04123 Dry eye syndrome of bilateral lacrimal glands: Secondary | ICD-10-CM | POA: Diagnosis not present

## 2015-08-12 ENCOUNTER — Telehealth: Payer: Self-pay | Admitting: Oncology

## 2015-08-12 ENCOUNTER — Ambulatory Visit (HOSPITAL_BASED_OUTPATIENT_CLINIC_OR_DEPARTMENT_OTHER): Payer: Medicare Other | Admitting: Oncology

## 2015-08-12 VITALS — BP 123/56 | HR 70 | Temp 97.9°F | Resp 18 | Ht 66.0 in | Wt 145.9 lb

## 2015-08-12 DIAGNOSIS — Z853 Personal history of malignant neoplasm of breast: Secondary | ICD-10-CM

## 2015-08-12 DIAGNOSIS — C50912 Malignant neoplasm of unspecified site of left female breast: Secondary | ICD-10-CM | POA: Insufficient documentation

## 2015-08-12 NOTE — Telephone Encounter (Signed)
Gave and prnted appt sched and avs fo rpt for NOV 2017
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
## 2015-08-23 DIAGNOSIS — I130 Hypertensive heart and chronic kidney disease with heart failure and stage 1 through stage 4 chronic kidney disease, or unspecified chronic kidney disease: Secondary | ICD-10-CM | POA: Diagnosis not present

## 2015-08-23 DIAGNOSIS — M1000 Idiopathic gout, unspecified site: Secondary | ICD-10-CM | POA: Diagnosis not present

## 2015-08-23 DIAGNOSIS — I10 Essential (primary) hypertension: Secondary | ICD-10-CM | POA: Diagnosis not present

## 2015-08-23 DIAGNOSIS — R609 Edema, unspecified: Secondary | ICD-10-CM | POA: Diagnosis not present

## 2015-08-25 ENCOUNTER — Other Ambulatory Visit: Payer: Self-pay | Admitting: Family Medicine

## 2015-08-25 DIAGNOSIS — M1000 Idiopathic gout, unspecified site: Secondary | ICD-10-CM | POA: Diagnosis not present

## 2015-08-25 DIAGNOSIS — E119 Type 2 diabetes mellitus without complications: Secondary | ICD-10-CM | POA: Diagnosis not present

## 2015-08-25 DIAGNOSIS — R609 Edema, unspecified: Secondary | ICD-10-CM

## 2015-08-25 DIAGNOSIS — I10 Essential (primary) hypertension: Secondary | ICD-10-CM | POA: Diagnosis not present

## 2015-08-26 ENCOUNTER — Other Ambulatory Visit: Payer: Medicare Other

## 2015-08-26 ENCOUNTER — Ambulatory Visit
Admission: RE | Admit: 2015-08-26 | Discharge: 2015-08-26 | Disposition: A | Discharge status: Home / Self Care | Payer: Medicare Other | Source: Ambulatory Visit | Attending: Family Medicine | Admitting: Family Medicine

## 2015-08-26 DIAGNOSIS — M79604 Pain in right leg: Secondary | ICD-10-CM | POA: Diagnosis not present

## 2015-08-26 DIAGNOSIS — R600 Localized edema: Secondary | ICD-10-CM | POA: Diagnosis not present

## 2015-08-26 DIAGNOSIS — R609 Edema, unspecified: Secondary | ICD-10-CM

## 2015-08-30 DIAGNOSIS — L84 Corns and callosities: Secondary | ICD-10-CM | POA: Diagnosis not present

## 2015-08-30 DIAGNOSIS — N189 Chronic kidney disease, unspecified: Secondary | ICD-10-CM | POA: Diagnosis not present

## 2015-08-30 DIAGNOSIS — M1711 Unilateral primary osteoarthritis, right knee: Secondary | ICD-10-CM | POA: Diagnosis not present

## 2015-08-30 DIAGNOSIS — N19 Unspecified kidney failure: Secondary | ICD-10-CM | POA: Diagnosis not present

## 2015-08-30 DIAGNOSIS — R609 Edema, unspecified: Secondary | ICD-10-CM | POA: Diagnosis not present

## 2015-08-30 DIAGNOSIS — M1000 Idiopathic gout, unspecified site: Secondary | ICD-10-CM | POA: Diagnosis not present

## 2015-09-09 ENCOUNTER — Other Ambulatory Visit: Payer: Self-pay | Admitting: Family Medicine

## 2015-09-09 ENCOUNTER — Ambulatory Visit
Admission: RE | Admit: 2015-09-09 | Discharge: 2015-09-09 | Disposition: A | Discharge status: Home / Self Care | Payer: Medicare Other | Source: Ambulatory Visit | Attending: Family Medicine | Admitting: Family Medicine

## 2015-09-09 DIAGNOSIS — M1711 Unilateral primary osteoarthritis, right knee: Secondary | ICD-10-CM

## 2015-09-09 DIAGNOSIS — S99921A Unspecified injury of right foot, initial encounter: Secondary | ICD-10-CM | POA: Diagnosis not present

## 2015-09-09 DIAGNOSIS — M869 Osteomyelitis, unspecified: Secondary | ICD-10-CM

## 2015-09-09 DIAGNOSIS — M79671 Pain in right foot: Secondary | ICD-10-CM | POA: Diagnosis not present

## 2015-09-09 DIAGNOSIS — M1712 Unilateral primary osteoarthritis, left knee: Secondary | ICD-10-CM | POA: Diagnosis not present

## 2015-09-21 DIAGNOSIS — R946 Abnormal results of thyroid function studies: Secondary | ICD-10-CM | POA: Diagnosis not present

## 2015-09-21 DIAGNOSIS — E119 Type 2 diabetes mellitus without complications: Secondary | ICD-10-CM | POA: Diagnosis not present

## 2015-09-23 DIAGNOSIS — M1000 Idiopathic gout, unspecified site: Secondary | ICD-10-CM | POA: Diagnosis not present

## 2015-09-23 DIAGNOSIS — M179 Osteoarthritis of knee, unspecified: Secondary | ICD-10-CM | POA: Diagnosis not present

## 2015-09-23 DIAGNOSIS — E039 Hypothyroidism, unspecified: Secondary | ICD-10-CM | POA: Diagnosis not present

## 2015-09-23 DIAGNOSIS — I10 Essential (primary) hypertension: Secondary | ICD-10-CM | POA: Diagnosis not present

## 2015-10-14 ENCOUNTER — Other Ambulatory Visit: Payer: Self-pay
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
## 2015-10-20 ENCOUNTER — Other Ambulatory Visit: Payer: Self-pay

## 2015-10-20 DIAGNOSIS — E7800 Pure hypercholesterolemia, unspecified: Secondary | ICD-10-CM

## 2015-10-20 DIAGNOSIS — I10 Essential (primary) hypertension: Secondary | ICD-10-CM

## 2015-10-20 DIAGNOSIS — E119 Type 2 diabetes mellitus without complications: Secondary | ICD-10-CM | POA: Insufficient documentation

## 2015-10-20 DIAGNOSIS — M109 Gout, unspecified: Secondary | ICD-10-CM

## 2015-10-20 DIAGNOSIS — E139 Other specified diabetes mellitus without complications: Secondary | ICD-10-CM

## 2015-10-20 NOTE — Patient Outreach (Signed)
[facility]) Care Management  10/20/2015  Alice Cochran November 02, 1923 [id number]  Initial Assessment   Referral Date: 10/06/15 Referral Source: MD: [name] Referral Issues: Home safety, medication management; patient is taking old pills or pills that are not on medication list.  MD has stressed to patient importance of compliance with [facility] services to avoid being discharged from physicians services.   Providers: Primary MD: [name] - last appt: 09/23/15  HH: None Insurance: Medicare   Social: Patient lives in her home alone. Patient states she does not understand why [name] is so insistent that patient participate with [facility] services. States she is doing just fine and does not need any nurses coming to her home. Patient verbalized understanding that [name] may discharge from physician services if patient is not compliant with MD recommendations.  Mobility: Ambulates with a cane.  Falls: (1) H/o fell on the Bus around 08/2015; fell backwards and hurt her spine. Literacy: Is patient able to read? Patient states she enjoys reading.  Transportation: Bus  Caregiver: Family if needed but did not provide specific names.  DME: Cane, CBG meter (current meter for about 4 years, BP cuff.   THN conditions: DM, HTN Admissions: 0 > past 6 months ER visits: 0 > past 6 months.  Blood Sugar reading this morning:  "it was fine" but unable to give numeric value.  Patient is not sure if her meter is reading correctly as compared to a meter she previously used.  Patient refused [place] visit to check meter.  Continues to state "I am fine and I do not need that." States h/o previous MD ordered patient to check BS 7 times a day (before and after each meal and at bedtime. States [name] has advised patient she only needs to check 4 times a day. Patient states she manages her BS without any issues and the reason she still checks after meals is  because she has a fear that her BS will drop too low while she is sleeping and she will not know. Patient engaged in conversation this call regarding MDs new guidelines to only check 4 times a day.  Patient states she takes Cinnamon for her Diabetes for about one year and has not taken Glyburide in about a year.   GOUT Patient only taking colchicine as needed when symptoms present.  Patient able to state Allopurinol is used for her gout and ordered to take 100 mg daily but did not confirm she is taking daily. MD referral note states Allopurinol 100 mg 2 tablets (200 mg) by mouth daily  Patient ask "Do I take this medication even when I am not having gout symptoms?"  Medications:  Taking less than 10 medications Co-pay cost issues: none Flu Vaccine:  States yes for 2016.  Patient states issue with past non-adherence associated to not knowing what her medications are for.  States she did call her pharmacy to confirm what condition the medication treats.    Consent for services Patient continues to refuse home visits from [place] but agrees to [facility] telephonic services.  Patient more engaged in call today.  Patient more trusting and remembered [facility] nurse from previous call.     Intervention / Plan:  Referral Date: 10/06/15 Screening Date: 10/13/2014 Case Management start date: 10/14/2015 Program: Diabetes  Service Agreement: Reviewed again this call.  RN CM provided patient education on importance of compliance and following MDs recommendations for best health outcomes.  RN CM  advised patient that MD wants patient to have the best outcomes as possible and physicians are always concerned that patients may not be fully understanding their care and instructions when they are not able to follow the directions the physician provides on MD visits.  RN CM advised patient that services are optional and patient does not have to accept home nursing visits and RN CM will attempt to work on issues  with patient over the phone; however, there may be a time when it would be appropriate for a home visit which would be agreed upon with patient at that time need was identified. Patient agreed to phone services only at this time.  RN CM will attempt to keep patient engaged in services with respect for patients rights and self directed care.  Diabetes RN CM identifies patient continues to check 7 rather than 4 times a day currently ordered.  RN CM identifies patient is fearful of low blood sugars while sleeping. RN CM provided patient education again this call that she only needs to check 4 times a day.  RN CM advised that Cinnamon has not been proven to treat Diabetes.  Advised that MD orders medications based on evidence (science) that the medication treats the condition with a good response.  RN CM will contact pharmacy to determine if patient is refilling Glyburide.  Emmi Education mailed 10/20/2015 -Diabetes:  When to check your blood sugar -Diabetes: Controlling Blood Sugar  Hypothyroidism  Patient states she had labs to check her thyroid and ask "Did the Allopurinol cause me to have a thyroid problem?  Is this a side effect of this medication?" Emmi Education mailed 10/20/2015 -Your Thyroid  Medications: Medication Reconciliation and review needed on Initial Assessment due to h/o non-compliance and taking old medications not ordered and not taking meds that are ordered.  RN CM will review MDs medication order and provide patient with medication education and review compliance.  RN CM mailed copy of medication list with to include reason or condition patient takes each medication.  RN CM will have patient read list back to this writer on next call to verify patient's ability to read.  Alice Cochran  -regarding Allopurinol side effects -Patient ask "Do I take this medication even when I am not having gout symptoms?" RN CM will strive to provide patient education on importance  of compliance to treat her conditions and find alternative ways to improve compliance such as medication box, pharmacy packs or other.   RN CM will follow-up next call to confirm receipt of [facility]  introductory package; not received on 10/20/2015 yet. (mailed 10/14/2015) RN CM will schedule for next contact call within 30 days for Telephone Assessment and / or care coordination services as needed.  RN CM advised to please notify MD of any changes in condition prior to scheduled appt's.  RN CM provided contact name and # [phone] or main office # [phone] and 24-hour nurse line # [phone].  RN CM confirmed patient is aware of 911 services for urgent emergency needs.  Mariann Laster, RN, BSN, [facility], CCM  [employer] Management Coordinator [phone] Direct [phone] Cell [phone] Office [phone] Fax

## 2015-10-20 NOTE — Addendum Note (Signed)
Addended by: Standley Brooking on: 10/20/2015 05:58 PM   Modules accepted: Orders

## 2015-11-03 DIAGNOSIS — M109 Gout, unspecified: Secondary | ICD-10-CM | POA: Diagnosis not present

## 2015-11-03 DIAGNOSIS — E119 Type 2 diabetes mellitus without complications: Secondary | ICD-10-CM | POA: Diagnosis not present

## 2015-11-03 DIAGNOSIS — R946 Abnormal results of thyroid function studies: Secondary | ICD-10-CM | POA: Diagnosis not present

## 2015-11-03 DIAGNOSIS — R7989 Other specified abnormal findings of blood chemistry: Secondary | ICD-10-CM | POA: Diagnosis not present

## 2015-11-03 DIAGNOSIS — R945 Abnormal results of liver function studies: Secondary | ICD-10-CM | POA: Diagnosis not present

## 2015-11-03 DIAGNOSIS — I10 Essential (primary) hypertension: Secondary | ICD-10-CM | POA: Diagnosis not present

## 2015-11-03 DIAGNOSIS — J029 Acute pharyngitis, unspecified: Secondary | ICD-10-CM | POA: Diagnosis not present

## 2015-11-03 DIAGNOSIS — B354 Tinea corporis: Secondary | ICD-10-CM | POA: Diagnosis not present

## 2015-11-03 DIAGNOSIS — E785 Hyperlipidemia, unspecified: Secondary | ICD-10-CM | POA: Diagnosis not present

## 2015-11-03 DIAGNOSIS — M25561 Pain in right knee: Secondary | ICD-10-CM | POA: Diagnosis not present

## 2015-11-07 ENCOUNTER — Other Ambulatory Visit: Payer: Self-pay

## 2015-11-07 DIAGNOSIS — I10 Essential (primary) hypertension: Secondary | ICD-10-CM

## 2015-11-07 DIAGNOSIS — E7800 Pure hypercholesterolemia, unspecified: Secondary | ICD-10-CM

## 2015-11-07 NOTE — Patient Outreach (Signed)
[facility] [facility]) Care Management  11/07/2015  Alice Cochran 22-Nov-1923 [id number]   Outreach call to patient.  Patient not reached.  Issue:  Patient reported she did not know what all her medications are prescribed for even though MD has reviewed and advised; patient forgets when it is not on the medication bottle.   RN CM created medication document indicating reason patient is taking medication (per patient request.  RN mailed to patient.    Kataryna Ermis   Allopurinol   Gout  Colchicine (Colcrys)   Gout  Furosemide (Lasix)   Fluid / swelling  (leg swelling)  Levothyroxine   Thyroid  Olmesartan  (Benicar)   Blood pressure  Prevastatin Sodium  (Pravachol)   Cholesterol   Glyburide (Diabeta)   Diabetes, Blood Sugar  Docusate Sodium  (Colace)   Constipation and softens stool   Mariann Laster, RN, BSN, [facility], [place] Management Care Management Coordinator [phone] Direct [phone] Cell [phone] Office [phone] Fax

## 2015-11-15 ENCOUNTER — Other Ambulatory Visit: Payer: Self-pay

## 2015-11-16 NOTE — Patient Outreach (Addendum)
I received a referral from Mariann Laster, RN telephonic nurse care manager on Alice Cochran.  She had two questions in regards to her allopurinol.  She wanted to know if she should continue to take it if she was not having a gouty symptoms.  The answer really depends on how often she is having gouty symptoms.  If she has multiple gouty attacks a year, she may benefit from staying on it all the time.  If she is not having gouty symptoms, she may not need to be on it.  I would advise her to have this discussion with her provider.  The ACP guidelines advise this discussion should occur prior to the patient being started on allopurinol.  Her second question was in regards to if allopurinol can has thyroid problems.  There is little data to support allopurinol causing thyroid problems.  I could not find anything that specifically linked allopurinol to issues with the TSH or thyroid.  I will close her out to pharmacy.  If she has any other pharmacy issues, I will be happy to assist.    [name], PharmD, [place] [phone]

## 2015-11-17 ENCOUNTER — Ambulatory Visit: Payer: Self-pay

## 2015-11-21 ENCOUNTER — Other Ambulatory Visit: Payer: Self-pay

## 2015-11-21 NOTE — Patient Outreach (Signed)
[place] [facility]) Care Management  11/21/2015  Chyrel Larocque 1924/03/24 [id number]   Outreach call #2 to patient for telephonic monthly assessment.  Patient not reached.  RN CM left HIPAA compliant voice message with name and contact number.  RN CM scheduled for next outreach call.  Update:  Pharmacy consult completed (see 11/16/15 note for further details).  Mariann Laster, RN, BSN, [facility], CCM  [employer] Management Coordinator [phone] Direct [phone] Cell [phone] Office [phone] Fax

## 2015-11-23 ENCOUNTER — Other Ambulatory Visit: Payer: Self-pay

## 2015-11-23 DIAGNOSIS — E7800 Pure hypercholesterolemia, unspecified: Secondary | ICD-10-CM

## 2015-11-23 DIAGNOSIS — I10 Essential (primary) hypertension: Secondary | ICD-10-CM

## 2015-11-23 NOTE — Patient Outreach (Signed)
[facility]) Care Management  11/23/2015  Alice Cochran 25-May-1924 [id number]  Outreach call #3 to patient for telephonic monthly assessment. Patient not reached.  RN CM left HIPAA compliant voice message with name and contact number.  RN CM requested patient return call back to RN CM.  RN CM mailed unsuccessful outreach letter.  (Update: Pharmacy consult completed (see 11/16/15 note for further details). RN CM will review case in 2 weeks and close if no response received back from patient.   Mariann Laster, RN, BSN, [facility], CCM  [employer] Management Coordinator [phone] Direct [phone] Cell [phone] Office [phone] Fax

## 2015-11-23 NOTE — Patient Outreach (Signed)
[facility]) Care Management  11/23/2015  Domino Fontana 21-Jan-1924 [id number]  Care Coordination Services  Contact call to [place]. [place], [place]  [phone] Issue:  Verification of last refill date on Glyburide 5 mg tablet.  Pharmacist states order received 02/04/2014 but patient never picked up the prescription.   Mariann Laster, RN, BSN, [facility], CCM  [employer] Management Coordinator [phone] Direct [phone] Cell [phone] Office [phone] Fax

## 2015-11-23 NOTE — Patient Outreach (Addendum)
[facility]) Care Management  11/23/2015  Alice Cochran 1923-12-11 [id number]   Telephonic Monthly Assessment  Referral Date: 10/06/15 Referral Source: MD: [name] Referral Issues: Home safety, medication management; patient is taking old pills or pills that are not on medication list.  MD has stressed to patient importance of compliance with [facility] services to avoid being discharged from physicians services.   Providers: Primary MD: [name] - last appt: 09/23/15  HH: None (Patient refuses home services) Insurance: Medicare   Social: Patient lives in her home alone. H/o Patient reported she does not understand why [name] is so insistent that patient participate with [facility] services. States she is doing just fine and does not need any nurses coming to her home. Patient verbalized understanding that [name] may discharge from physician services if patient is not compliant with MD recommendations. Patient has agreed to telephonic services only.  Mobility: Ambulates with a cane.  Falls: (1) H/o fell on the Bus around 08/2015; fell backwards and hurt her spine. Literacy: Is patient able to read? Patient states she enjoys reading.  Transportation: Bus, [place] - states incident of having too many groceries recently and [employer] called supervisor for pick up.   Caregiver: Family if needed but avoids.  States her daughter lives in [place] and is a Education officer, museum working 4 jobs.  States she does not bother her.  DME: Cane, CBG meter (current meter for about 4 years and may not be working) see note below, BP cuff.   THN conditions: DM, HTN Admissions: 0 > past 6 months ER visits: 0 > past 6 months.  Patient reported on 10/20/15 contact call:  Blood Sugar reading this morning: "it was fine" but unable to give numeric value. Patient is not sure if her meter is reading correctly as compared to a meter she previously used. Patient refused [place]  visit to check meter. Continues to state "I am fine and I do not need that." 11/23/15 update:  Patient state she recently had a reading in the 600's.  States again this call she does not believe her meter to be working and may just need a battery.  States she dropped her meter and thinks she may have damaged the meter.   RN CM again this call asked patient's permission to provide [place] Nurse visit to assess meter and readings.  Advised that meter may be correct and patient is high risk for serious complications if BS is truly this high; especially since patient elects not to take her DM medication.  Advised that if patient had a reaction to high blood sugar she would risk coma and lives alone presenting a safety situation.  RN CM offered to contact MD to get new order for new CBG meter.  Patient quickly responded "no, I can take care of that myself." States her plan is to contact the meter company first.  RN CM instructed patient to place her call and call RN CM back with update on progress.  RN CM advised if patient is unable to successfully mange this need to notify RN CM so that RN CM can assist.  RN CM advised patient that a MD order for the meter is necessary in order to get Medicare coverage.    Self BS check issues: H/o previous MD ordered patient to check BS 7 times a day (before and after each meal and at bedtime. States [name] has advised patient she only needs to check 4  times a day. Patient states she manages her BS without any issues and the reason she still checks after meals is because she has a fear that her BS will drop too low while she is sleeping and she will not know. Patient engaged in conversation with RN CM on 10/20/2015 call regarding MDs new guidelines to only check 4 times a day. Patient continues to take Cinnamon for her Diabetes and has not taken Glyburide in about a year.  RN CM will continue to follow  -progress on CBG meter replacement or battery  replacement -Blood Sugar check compliance  Emmi Education mailed 10/20/2015(reviewed 11/23/15) -Diabetes: When to check your blood sugar -Diabetes: Controlling Blood Sugar  GOUT Patient only taking colchicine as needed when symptoms present.  Patient able to state Allopurinol is used for her gout and ordered to take 100 mg daily but did not confirm she is taking daily. MD referral note states Allopurinol 100 mg 2 tablets (200 mg) by mouth daily  RN CM clarified on 10/20/15 contact call that patient should take medication as ordered when patient asked:  "Do I take this medication even when I am not having gout symptoms?"  Hypothyroidism  "Did the Allopurinol cause me to have a thyroid problem? Is this a side effect of this medication?" Emmi Education mailed 10/20/2015 (reviewed 11/23/2015) -Your Thyroid  Pharmacy consult completed 11/16/15 with [name], PharmD, [place], [place] [phone] Patient wanted to know if she should continue to take it if she was not having a gouty symptoms.  -"The answer really depends on how often she is having gouty symptoms. If she has multiple gouty attacks a year, she may benefit from staying on it all the time. If she is not having gouty symptoms, she may not need to be on it. I would advise her to have this discussion with her provider. The ACP guidelines advise this discussion should occur prior to the patient being started on allopurinol."  Can allopurinol cause thyroid problems.  -"There is little data to support allopurinol causing thyroid problems. I could not find anything that specifically linked allopurinol to issues with the TSH or thyroid."   Medications:  Taking less than 10 medications Takes medications directly from medication bottles and refused offer for RN CM to send patient a medication box.  Co-pay cost issues: none Flu Vaccine: States yes for 2016.  Patient states issue with past  non-adherence associated to not knowing what her medications are for. RN CM sent medication document to patient with name of med and what med is used for on 11/07/2015.  Patient confirmed she received and found list to be helpful.  Patient confirmed she does not have the list easily accessible when needed.  RN CM advised to place list near medications and use when taking medications.   Consent for services Patient continues to refuse home visits from [place] but agrees to [facility] telephonic services.  Patient more engaged in call today. Patient more trusting and remembered [facility] nurse from previous call.  Patient confirmed receipt of Introductory package. (mailed 10/14/2015)  Plan:  Referral Date: 10/06/15 Screening Date: 10/13/2014 Case Management start date: 10/14/2015 Program: Diabetes  Service Agreement: Reviewed again this call.  RN CM provided patient education on importance of compliance and following MDs recommendations for best health outcomes.  RN CM advised patient that MD wants patient to have the best outcomes as possible and physicians are always concerned that patients may not be fully understanding their care  and instructions when they are not able to follow the directions the physician provides on MD visits.  RN CM advised patient that services are optional and patient does not have to accept home nursing visits and RN CM will attempt to work on issues with patient over the phone; however, there may be a time when it would be appropriate for a home visit which would be agreed upon with patient at that time need was identified. Patient agreed to phone services only again this call even though RN CM recommended [place] visit.   RN CM will attempt to keep patient engaged in services with respect for patients rights and self directed care. RN CM will continue to work toward gaining trust in patient/nurse relationship to decrease healthcare barriers.   Diabetes Reviewed  again this call.  RN CM identifies patient continues to check 7 rather than 4 times a day currently ordered.  RN CM identifies patient is fearful of low blood sugars while sleeping. RN CM provided patient education again this call that she only needs to check 4 times a day.  RN CM advised that Cinnamon has not been proven to treat Diabetes. Advised that MD orders medications based on evidence (science) that the medication treats the condition with a good response.  RN CM will contact pharmacy to determine if patient is refilling Glyburide.   Diabetes Education mailed 11/23/2015 -[employer] booklets -Diabetes and you -Caring for your diabetes -Your blood sugar diary -Diabetes medicines Basic Carb counting poster chart.  Medications: RN CM will strive to provide patient education on importance of compliance to treat her conditions and find alternative ways to improve compliance such as medication box, pharmacy packs or other.    RN CM will schedule for next contact call within 30 days for Telephone Assessment and / or care coordination services as needed.  RN CM advised to please notify MD of any changes in condition prior to scheduled appt's.  RN CM provided contact name and # [phone] or main office # [phone] and 24-hour nurse line # [phone].  RN CM confirmed patient is aware of 911 services for urgent emergency needs.  Mariann Laster, RN, BSN, [facility], CCM  [employer] Management Coordinator [phone] Direct [phone] Cell [phone] Office [phone] Fax

## 2015-11-23 NOTE — Patient Outreach (Addendum)
[place] [facility]) Care Management  11/23/2015  Tattianna Shiflett Jun 14, 1924 [id number]  Care Coordination Services  Contact call to Primary MD:  [name] Issue:  Verification of patient's next scheduled appt.  Office contact:  Lattie Haw states patient requested to move medical records and care to new Primary MD.  Lattie Haw unable to verify where records sent or locate Medical release document for further verification.  RN CM unaware of this change: H/o patient has not reported that she has changed her Primary MD.  RN CM requested [facility] consult Dr. Brigitte Pulse for confirmation on where medical records sent.  RN CM advised [facility]N needs to contact new PCP to provide patient updates.  RN CM left name and contact # for call back report.   Mariann Laster, RN, BSN, [facility], CCM  [employer] Management Coordinator [phone] Direct [phone] Cell [phone] Office [phone] Fax

## 2015-11-25 ENCOUNTER — Other Ambulatory Visit: Payer: Self-pay

## 2015-11-25 NOTE — Patient Outreach (Signed)
[facility]) Care Management  11/25/2015  Shanik Kovalev 10-07-1924 [id number]   Care Coordination Services  Inbound call from Dr. Ival Bible office with update that patient requested medical records be transferred to [facility].    Plan:   RN CM will contact physician practice to obtain MD assignment and appointment details.   Mariann Laster, RN, BSN, [facility], CCM  [employer] Management Coordinator [phone] Direct [phone] Cell [phone] Office [phone] Fax

## 2015-11-25 NOTE — Patient Outreach (Signed)
[facility]) Care Management  11/25/2015  Alice Cochran 1924/02/17 [id number]   Care Coordination Services  Contact call to [facility].  Contact:  Angie verifies patient has seen Dr. Sheryn Bison in the practice in the past but last appt was with Dr. Antony Blackbird on 11/03/15 and next appt is 03/02/16 with Dr. Chapman Fitch.    Plan:   RN CM will discuss New MD with patient on next contact call.  H/o patient has not reported this update to RN CM.  RN CM will send New MD notification of THN involvement via Physicians Barrier letter and Initial Assessment and last RN CM progress note.   RN CM will continue to follow patient and address Diabetes non-compliance with CBG monitoring and Diabetes medication orders.   RN CM updated Epic with new PCP Assignment.    Mariann Laster, RN, BSN, [facility], CCM  [employer] Management Coordinator [phone] Direct [phone] Cell [phone] Office [phone] Fax

## 2015-12-07 ENCOUNTER — Other Ambulatory Visit: Payer: Self-pay

## 2015-12-07 DIAGNOSIS — E7800 Pure hypercholesterolemia, unspecified: Secondary | ICD-10-CM

## 2015-12-07 DIAGNOSIS — I10 Essential (primary) hypertension: Secondary | ICD-10-CM

## 2015-12-07 NOTE — Patient Outreach (Signed)
[place] [facility]) Care Management  12/07/2015  Cathie Lansang 1923-12-11 [id number]   Inbound voice message received from patient stating she received educational materials mailed on Diabetes and how much she has enjoyed.  Patient requesting any additional information that can be provided on Diabetic Meal Menus.  Plan: RN CM will mail additional educational materials as requested -Carb counting and Meal planning  -Snacks and carbohydrate values RN CM will review educational mailings on next contact call within one month.   Mariann Laster, RN, BSN, [facility], CCM  [employer] Management Coordinator [phone] Direct [phone] Cell [phone] Office [phone] Fax

## 2015-12-21 ENCOUNTER — Other Ambulatory Visit: Payer: Self-pay

## 2015-12-21 NOTE — Patient Outreach (Signed)
[facility]) Care Management  12/21/2015  Alice Cochran 21-Feb-1924 [id number]  Telephonic Monthly Assessment  Referral Date: 10/06/15 Referral Source: MD: [name] Referral Issues: Home safety, medication management; patient is taking old pills or pills that are not on medication list.   Providers: New Primary MD:  Dr. Marciano Sequin [facility] at [facility] [phone].last appt 11/03/15 and next appt is 03/02/16  Patient states Primary MD has made New referral to Endocrinologist for Diabetes management.  Nutritional Class referral sent by Dr. Chapman Fitch 11/03/15.  Patient has not attended 1st class yet.  HH: None (Patient refuses home services) Insurance: Medicare   Social: Patient lives in her home alone.  Mobility: Ambulates with a cane.  Falls: (1) H/o fell on the Bus around 08/2015; fell backwards and hurt her spine.  Transportation: Bus, [employer]  Caregiver: Family if needed but avoids. States her daughter, Alice Cochran [id number]  lives in [place] and is a Education officer, museum working 4 jobs. States she does not bother her.  DME: Cane, CBG meter (current meter for about 4 years and may not be working) see note below, BP cuff.   THN conditions: DM, HTN Admissions: 0 > past 6 months ER visits: 0 > past 6 months.  Patient states her CBG meter is not working and states she has only had meter about 4 years and unable to get replaced/covered under Medicare.   [employer] Education mailed 10/20/2015(reviewed 11/23/15) -Diabetes: When to check your blood sugar -Diabetes: Controlling Blood Sugar Diabetes Education mailed 11/23/2015 (reviewed 12/21/2014) -Cornerstones4Car booklets -Diabetes and you -Caring for your diabetes -Your blood sugar diary -Diabetes medicines Basic Carb counting poster chart. Education mailed 12/07/2015 (Reviewed 12/21/15) -Carb counting and Meal planning -Snacks and carbohydrate values  Medications:  Taking less than 10  medications Takes medications directly from medication bottles and refused offer for RN CM to send patient a medication box.  Co-pay cost issues: none Flu Vaccine: States yes for 2016.  C/O high BS reading at night but low in the morning.  States she gets the shakes when BS drops below 72.  Weight: 156  Consent for services Patient continues to refuse home visits from [place] but agrees to [facility] telephonic services.   Plan:  Referral Date: 10/06/15 Screening Date: 10/13/2014 Case Management start date: 10/14/2015 Program: Diabetes  RN CM discussed New MD with patient.   RN CM will send New MD notification of THN involvement via Phys[employer] [facility] letter and Initial Assessment and last RN CM progress note.  RN CM will continue to follow patient and address Diabetes non-compliance with CBG monitoring and Diabetes medication orders.  RN CM will contact Primary and request order be sent to pharmacy for glucometer to process coverage determination.  RN CM will request updated medication order from new Primary MD. RN CM will mail patient a CBG meter if no coverage option.   RN CM will schedule for next contact call within 30 days for Telephone Assessment and / or care coordination services as needed.  RN CM advised to please notify MD of any changes in condition prior to scheduled appt's.  RN CM provided contact name and # [phone] or main office # [phone] and 24-hour nurse line # [phone].  RN CM confirmed patient is aware of 911 services for urgent emergency needs.  Mariann Laster, RN, BSN, [facility], CCM  [employer] Management Coordinator [phone] Direct [phone] Cell [phone] Office [phone] Fax

## 2015-12-22 NOTE — Patient Outreach (Signed)
[facility]) Care Management  12/22/2015  Alice Cochran November 05, 1923 [id number]  H/o THN involvement letter and Initial Assessment sent to Dr. Chapman Fitch on 11/25/15.   Mariann Laster, RN, BSN, [facility], CCM  [employer] Management Coordinator [phone] Direct [phone] Cell [phone] Office [phone] Fax

## 2016-01-19 ENCOUNTER — Ambulatory Visit: Payer: Self-pay

## 2016-01-20 ENCOUNTER — Ambulatory Visit: Payer: Self-pay

## 2016-01-24 ENCOUNTER — Encounter: Payer: 59 | Attending: Endocrinology | Admitting: Dietician

## 2016-01-24 ENCOUNTER — Encounter: Payer: Self-pay | Admitting: Dietician

## 2016-01-24 ENCOUNTER — Other Ambulatory Visit: Payer: Self-pay

## 2016-01-24 VITALS — Ht 66.66 in | Wt 155.0 lb

## 2016-01-24 DIAGNOSIS — E119 Type 2 diabetes mellitus without complications: Secondary | ICD-10-CM | POA: Diagnosis not present

## 2016-01-24 NOTE — Patient Instructions (Addendum)
Continue to stay as active as possible. Don't skip meals. Drink and Ensure or Boost if you feel that you are not eating well. Be sure to have protein each time you eat. (meat, chicken, fish, cheese, cottage cheese, lite yogurt, egg, beans)  Half of your plate should be vegetables A quarter of your plate should be protein A quarter of your plate should be carbohydrates (sweet potato, potato, rice, pasta, bread, crackers, or fruit)

## 2016-01-24 NOTE — Patient Outreach (Signed)
[facility]) Care Management  01/24/2016  Alice Cochran 04-28-1924 [id number]   Telephonic Monthly Assessment   Outreach call #1 to patient.  Patient not reached.  RN CM left HIPAA compliant voice message with name and number for call back.  RN CM scheduled for next outreach call within one week.   Mariann Laster, RN, BSN, [facility], CCM  [employer] Management Coordinator [phone] Direct [phone] Cell [phone] Office [phone] Fax

## 2016-01-24 NOTE — Progress Notes (Signed)
  Medical Nutrition Therapy:  Appt start time: 1030 end time:  1130.   Assessment:  Primary concerns today: Patient is here alone.  She has had diabetes for the past several years, HTN and hyperlipidemia.  She reports that her weight has been stable.  155 lbs today.  Her HgbA1C was 6.6%.  Patient states that she gets low blood sugar at times.  She has been eating less overall and will eat things with sugar to avoid the low blood sugar.  Patient lives alone.  She took the scat  bus here.  She shops and cooks for herself.   Preferred Learning Style:   No preference indicated   Learning Readiness:   Ready  MEDICATIONS: see list to include glyburide, lasix, and provachol   DIETARY INTAKE:  Usual eating pattern includes 3 meals and 2 snacks per day. Avoided foods include milk (dislikes).       24-hr recall:  B ( AM):  Regular oatmeal dry sprinkled on fruit, 1 slice rye bread with jelly, coffee with 2 ounces Ensure Snk ( AM): none  L ( PM): vegetables, salad with cheese Snk ( PM): salad or fruit cocktail D ( PM): fish or chicken, vegetables, potato salad or coleslaw, rye bread or whole grain bread with jelly Snk (9 PM): bread or cookies and banana Beverages: green tea with splenda, coffee, water, diet cranberry juice, coconut milk  Usual physical activity: Does an armchair exercise class from [employer] at 5 am  Estimated energy needs: 1600 calories 180 g carbohydrates 120 g protein 44 g fat  Progress Towards Goal(s):  In progress.   Nutritional Diagnosis:  NB-1.1 Food and nutrition-related knowledge deficit As related to balance of carbohydrate, protein, and fat.  As evidenced by diet recall.    Intervention:  Nutrition counseling/education related to adequate nutrition for age and medical issues including diabetes.  Provided sample of Boost.  Continue to stay as active as possible. Don't skip meals. Drink and Ensure or Boost if you feel that you are not eating well. Be sure to  have protein each time you eat. (meat, chicken, fish, cheese, cottage cheese, lite yogurt, egg, beans)  Half of your plate should be vegetables A quarter of your plate should be protein A quarter of your plate should be carbohydrates (sweet potato, potato, rice, pasta, bread, crackers, or fruit)  Teaching Method Utilized:  Visual Auditory Hands on  Handouts given during visit include: Living Well with Diabetes Meal Plan Card Snack list  Barriers to learning/adherence to lifestyle change: memory/age  Demonstrated degree of understanding via:  Teach Back   Monitoring/Evaluation:  Dietary intake, exercise, and body weight prn.

## 2016-01-26 ENCOUNTER — Ambulatory Visit: Payer: Self-pay

## 2016-01-27 ENCOUNTER — Other Ambulatory Visit: Payer: Self-pay

## 2016-01-27 NOTE — Patient Outreach (Signed)
[facility]) Care Management  01/27/2016  Valmai Vongunten January 01, 1924 [id number]  Telephonic Monthly Assessment   Outreach call #2 to patient.  Patient not reached.  No answer following several rings. RN CM scheduled for next outreach call within one week.  Mariann Laster, RN, BSN, [facility], CCM  [employer] Management Coordinator [phone] Direct [phone] Cell [phone] Office [phone] Fax

## 2016-01-30 ENCOUNTER — Other Ambulatory Visit: Payer: Self-pay

## 2016-01-30 VITALS — Wt 155.0 lb

## 2016-01-30 DIAGNOSIS — E7800 Pure hypercholesterolemia, unspecified: Secondary | ICD-10-CM

## 2016-01-30 DIAGNOSIS — I10 Essential (primary) hypertension: Secondary | ICD-10-CM

## 2016-01-30 NOTE — Patient Outreach (Addendum)
[facility]) Care Management  01/30/2016  Alice Cochran [birth year] [id number]   Telephonic Monthly Assessment  Subjective: Patient states she gets confused with her bills and has a stack of bills from the MD office.  States she thinks that she is being billed for [facility] services.  States she does not want RN CM to follow-up on this yet; she wants to gather her bills together and review them again to be sure.  States she completed the DM Nutrition class which was for only one visit.    Providers: Primary MD: Dr. Marciano Sequin Practice at [facility] [phone].last appt 11/03/15 and next appt is 03/02/16 Patient states Primary MD has made New referral to Endocrinologist for Diabetes management.  Nutritional Class referral sent by Dr. Chapman Fitch 11/03/15. Class completed 01/24/16 Oncologist:  Next appt 08/08/16. HH: None (Patient refuses home services and / or [place] services) Insurance: [facility] Medicare   Social: Patient lives in her home alone.  States her daughter did pick her up for Easter.  Mobility: Ambulates with a cane.  Falls: (1) H/o fell on the Bus around 08/2015; fell backwards and hurt her spine.  Transportation: Bus, [employer]  Caregiver: Family if needed but avoids. States her daughter, Drue Second [phone] lives in [place] and is a Education officer, museum working 4 jobs. States she does not bother her.  Resources:  Patient states she gets confused with her bills and has a stack of bills from the MD office.  States she thinks that she is being billed for [facility] services.  States she does not want RN CM to follow-up on this yet; she wants to gather her bills together and review them again to be sure.   RN CM discussed option of [facility] Community SW for assistance and review but patient continues to decline any type of Community home visits.  DME: Cane, CBG meter (current meter for about 4 years and may not be working) see note below, BP cuff.   THN  conditions: DM, HTN ER visits: 0  / Admissions: 0  CBG meter: has only had meter about 4 years and unable to get replaced/covered under Medicare. RN CM advised patient that follow-up confirmed MD ordered meter in 2015 but was never picked up at pharmacy.  Patient has changed Primary MD's since 10/2015 and will need a new order from new [place].   Emmi Education mailed 10/20/2015(reviewed 11/23/15) -Diabetes: When to check your blood sugar -Diabetes: Controlling Blood Sugar Diabetes Education mailed 11/23/2015 (reviewed 12/21/2014) -Cornerstones4Car booklets -Diabetes and you -Caring for your diabetes -Your blood sugar diary -Diabetes medicines Basic Carb counting poster chart. Education mailed 12/07/2015 (Reviewed 12/21/15) -Carb counting and Meal planning -Snacks and carbohydrate values  Medications:  Taking less than 10 medications Takes medications directly from medication bottles and refused offer for RN CM to send patient a medication box.  Co-pay cost issues: none Flu Vaccine: yes for 2016.  Weight: 156 States no hypo or hyper glycemic episodes over the past month.  Patient continues to take Cinnamon for DM management.    Objective:   Encounter Medications:  Outpatient Encounter Prescriptions as of 01/30/2016  Medication Sig Note  . aspirin 81 MG tablet Take 81 mg by mouth daily.   . beta carotene w/minerals (OCUVITE) tablet Take 1 tablet by mouth daily.   Marland Kitchen CINNAMON PO Take by mouth.   . colchicine 0.6 MG tablet Take 1 tablet (0.6 mg total) by mouth daily. 10/20/2015: Taking as needed only.   Marland Kitchen  furosemide (LASIX) 40 MG tablet Take 40 mg by mouth daily.   . Ginkgo Biloba 40 MG TABS Take by mouth.   Marland Kitchen glucosamine-chondroitin 500-400 MG tablet Take 1 tablet by mouth 3 (three) times daily.   . metroNIDAZOLE (METROGEL) 1 % gel Apply topically daily.   Marland Kitchen olmesartan-hydrochlorothiazide (BENICAR HCT) 40-25 MG per tablet Take 1 tablet by mouth daily.   . pravastatin (PRAVACHOL) 40  MG tablet Take 40 mg by mouth daily.   Marland Kitchen UNABLE TO FIND Tumeric tablets   . glyBURIDE (DIABETA) 5 MG tablet Take 5 mg by mouth 2 (two) times daily with a meal. Reported on 01/30/2016 10/20/2015: Patient currently using Cinnamon as her diabetes treatment.    No facility-administered encounter medications on file as of 01/30/2016.    Functional Status:  In your present state of health, do you have any difficulty performing the following activities: 01/30/2016 10/20/2015  Hearing? N N  Vision? N N  Difficulty concentrating or making decisions? N N  Walking or climbing stairs? N N  Dressing or bathing? N N  Doing errands, shopping? N N  Preparing Food and eating ? N N  Using the Toilet? N N  In the past six months, have you accidently leaked urine? N N  Do you have problems with loss of bowel control? N N  Managing your Medications? N N  Managing your Finances? N N  Housekeeping or managing your Housekeeping? N N    Fall/Depression Screening: PHQ 2/9 Scores 01/30/2016 01/24/2016 10/14/2015  PHQ - 2 Score 0 0 0   Fall Risk  01/30/2016 01/24/2016 10/14/2015  Falls in the past year? Yes No Yes  Number falls in past yr: 1 - 1  Injury with Fall? Yes - Yes  Risk Factor Category  High Fall Risk - High Fall Risk  Risk for fall due to : History of fall(s);Impaired mobility;Impaired balance/gait - History of fall(s);Impaired balance/gait  Risk for fall due to (comments): uses a cane - uses a cane  Follow up Falls evaluation completed;Falls prevention discussed - Falls prevention discussed     Assessment and Plan:  Referral Date: 10/06/15 Referral Source: MD: [name] Referral Issues: Home safety, medication management; patient is taking old pills or pills that are not on medication list. Screening Date: 10/13/2014 Case Management start date: 10/14/2015 Program: Diabetes   Diabetes non-adherence with CBG monitoring and diabetes medication orders.  -Patient prefers to take [place].      -RN CM will contact Primary and request CBG meter order be sent to pharmacy to patient's current pharmacy:  [facility] as patient has [facility] Medicare coverage.   -RN CM will follow-up with Dr. Chapman Fitch for next scheduled appt and last updated medication orders.  -RN CM will contact Dr. Chapman Fitch to determine if Endocrinology referral was made and to whom.   RN CM will schedule for next contact call within 30 days for Telephone Assessment and / or care coordination services as needed.  RN CM advised to please notify MD of any changes in condition prior to scheduled appt's.  RN CM provided contact name and # [phone] or main office # [phone] and 24-hour nurse line # [phone].  RN CM confirmed patient is aware of 911 services for urgent emergency needs  [facility] CM Care Plan Problem One        Most Recent Value   Care Plan Problem One  Diabetes Management non-adherence   Role Documenting the Problem One  Care Management Telephonic Coordinator  Care Plan for Problem One  Active   THN Long Term Goal (31-90 days)  Patient's understanding of diabetes home management program will improve within 90 days.    THN Long Term Goal Start Date  01/30/16 [Goal continued]   Interventions for Problem One Long Term Goal  RN CM will provide patient with education on diabetes management over the next 90 days.    THN CM Short Term Goal #1 (0-30 days)  Patient will check BS 4 times a day rather than 7 as MD has only order 4 times a day within 30 days.    THN CM Short Term Goal #1 Start Date  01/30/16   THN CM Short Term Goal #1 Met Date  -- [Goal continued]   Interventions for Short Term Goal #1  RN CM will contact Primary MD to discuss CBG order and BS testing confirmation over the next 30 days.    THN CM Short Term Goal #2 (0-30 days)  Patient will adhere to new referral for Diabetes management over the next 30 days.    THN CM Short Term Goal #2 Start Date  12/22/15   [facility] CM Short Term Goal #2 Met Date   01/30/16 [Nutrition Consult completed. ]   Interventions for Short Term Goal #2  RN CM will contact PCP for new referral information to encourage adherence with patient over the next 30 days.    THN CM Short Term Goal #3 (0-30 days)  Patient will adhere to DM Nutritional class ordered by PCP over the next 30 days.    THN CM Short Term Goal #3 Start Date  12/22/15   [facility] CM Short Term Goal #3 Met Date  01/30/16   Interventions for Short Tern Goal #3  RN CM will contact PCP for clarification on Nutritional Referral over the next 30 days to promote adherence.     THN CM Care Plan Problem Two        Most Recent Value   Care Plan Problem Two  Medication Non-adherence   Role Documenting the Problem Two  Care Management Telephonic Coordinator   Care Plan for Problem Two  Active   THN CM Short Term Goal #1 (0-30 days)  Patient will take medications as ordered over the next 30 days.    THN CM Short Term Goal #1 Start Date  01/30/16 [Goal continued.  continues to take only Cinnamon]   Interventions for Short Term Goal #2   RN CM will provide patient education and tools to improve compliance over the next 30 days    THN CM Short Term Goal #2 (0-30 days)  Patient will take medications as per NEW PCP has ordered over the next 30 days.    THN CM Short Term Goal #2 Start Date  01/30/16 [Goal continued. ]   Interventions for Short Term Goal #2  RN CM will contact New PCP for medication order over the next 30 days.     [facility] CM Care Plan Problem Three        Most Recent Value   Care Plan Problem Three  Diabetes: DME   Role Documenting the Problem Three  Care Management Telephonic Coordinator   Care Plan for Problem Three  Active   THN CM Short Term Goal #1 (0-30 days)  Patient will acquire new CBG meter over the next 30 days under plan coverage.   THN CM Short Term Goal #1 Start Date  01/30/16   Interventions for Short Term Goal #1  RN CM will contact Primary MD for new CBG meter over the next 30 days.        Mariann Laster, RN, BSN, [facility], CCM  [employer] Management Coordinator [phone] Direct [phone] Cell [phone] Office [phone] Fax

## 2016-02-03 ENCOUNTER — Other Ambulatory Visit: Payer: Self-pay

## 2016-02-03 NOTE — Patient Outreach (Signed)
[facility]) Care Management  02/03/2016  Johnae Iannone 02/26/1924 [id number]   Care Coordination Services  Contact call to Dr. Antony Blackbird, Primary MD.  RN CM left the following request with physicians line assistant: -Please have MD write new order for CBG meter and testing supplies with testing order details in order to meet Medicare coverage criteria to patient's preferred pharmacy, [place] last appt date and next appt date details.  -requested last appt date Medication review and medication orders for verification and teaching needs as patient is unable to provide clarity and continues to take cinnamon rather than her DM medications.  Please fax to [place] / ATTN:  Kijana Cromie.  -Requested clarification if Endocrinologist referral has been made; as patient understands this to be the plan but does not know to whom or when.   RN CM updated that patient has a history on non-adherence and RN CM needs clarification in order to assist patient with teaching needs and care coordination of services.   RN CM provided all call back numbers and fax # for communication of medications.   Plan: MD office to notify RN CM of the above requested information.   Mariann Laster, RN, BSN, [facility], CCM  [employer] Management Coordinator [phone] Direct [phone] Cell [phone] Office [phone] Fax

## 2016-02-27 ENCOUNTER — Ambulatory Visit: Payer: Self-pay

## 2016-02-28 ENCOUNTER — Ambulatory Visit: Payer: Self-pay

## 2016-02-29 ENCOUNTER — Ambulatory Visit: Payer: Self-pay

## 2016-03-01 ENCOUNTER — Ambulatory Visit: Payer: Self-pay

## 2016-03-02 ENCOUNTER — Ambulatory Visit: Payer: Self-pay

## 2016-03-07 ENCOUNTER — Other Ambulatory Visit: Payer: Self-pay | Admitting: Family Medicine

## 2016-03-07 DIAGNOSIS — E2839 Other primary ovarian failure: Secondary | ICD-10-CM

## 2016-03-08 DEATH — deceased

## 2016-03-13 ENCOUNTER — Other Ambulatory Visit: Payer: Self-pay

## 2016-03-13 DIAGNOSIS — I10 Essential (primary) hypertension: Secondary | ICD-10-CM

## 2016-03-13 DIAGNOSIS — E7800 Pure hypercholesterolemia, unspecified: Secondary | ICD-10-CM

## 2016-03-13 NOTE — Patient Outreach (Signed)
[facility]) Care Management  03/13/2016  Alice Cochran 03/24/1924 [id number]  Telephonic Monthly Assessment  Subjective: Patient states she completed follow-up appt with Endocrinologist and confirmed no thyroid condition.    Providers: Primary MD: Dr. Marciano Sequin [facility] [phone].last appt 03/02/16  Endocrinologist: Dr. Jacelyn Pi Nutritional Class:  01/24/16 Oncologist: Next appt 08/08/16. HH: None  And H/o (Patient refuses home services and / or [place] services) Insurance: U[employer] Medicare   Social: Patient lives in her home alone.  Mobility: Ambulates with a cane.  Falls: (1) H/o fell on the Bus around 08/2015; fell backwards and hurt her spine.  Transportation: Bus, [employer]  Caregiver: Family if needed but avoids. Daughter, Drue Second [phone] lives in [place] and is a Education officer, museum working 4 jobs. States she does not bother her.   DME: Cane, CBG meter (Dr. Chapman Fitch called new CBG meter order to [facility] / [address] on 02/03/16), BP cuff.   THN conditions: DM, HTN ER visits: 0 / Admissions: 0  CBG meter: has only had meter about 4 years and unable to get replaced/covered under Medicare. RN CM advised patient that follow-up confirmed MD ordered meter in 2015 but was never picked up at pharmacy. Patient has changed Primary MD's since 10/2015 and will need a new order from new Primary. Dr. Chapman Fitch called new CBG meter order to [facility] on [address] on 02/03/16).  Weight: 157 A1C: 6.6 BP 132/70 Denies any hypo or hyper glycemic episodes over the past month.  [facility] Education mailed 10/20/2015(reviewed 11/23/15) -Diabetes: When to check your blood sugar -Diabetes: Controlling Blood Sugar Diabetes Education mailed 11/23/2015 (reviewed 12/21/2014) -Cornerstones4Car booklets -Diabetes and you -Caring for your diabetes -Your blood sugar diary -Diabetes medicines Basic Carb counting poster  chart. Education mailed 12/07/2015 (Reviewed 12/21/15) -Carb counting and Meal planning -Snacks and carbohydrate values  Medications:  Taking less than 10 medications Takes medications directly from medication bottles and refused offer for RN CM to send patient a medication box.  Co-pay cost issues: none Flu Vaccine: yes for 2016.   Encounter Medications:  Outpatient Encounter Prescriptions as of 03/13/2016  Medication Sig Note  . aspirin 81 MG tablet Take 81 mg by mouth daily.   . beta carotene w/minerals (OCUVITE) tablet Take 1 tablet by mouth daily.   Marland Kitchen CINNAMON PO Take by mouth.   . colchicine 0.6 MG tablet Take 1 tablet (0.6 mg total) by mouth daily. 10/20/2015: Taking as needed only.   . furosemide (LASIX) 40 MG tablet Take 40 mg by mouth daily.   . Ginkgo Biloba 40 MG TABS Take by mouth.   Marland Kitchen glucosamine-chondroitin 500-400 MG tablet Take 1 tablet by mouth 3 (three) times daily.   . metroNIDAZOLE (METROGEL) 1 % gel Apply topically daily.   Marland Kitchen olmesartan-hydrochlorothiazide (BENICAR HCT) 40-25 MG per tablet Take 1 tablet by mouth daily.   . pravastatin (PRAVACHOL) 40 MG tablet Take 40 mg by mouth daily.   Marland Kitchen UNABLE TO FIND Tumeric tablets   . glyBURIDE (DIABETA) 5 MG tablet Take 5 mg by mouth 2 (two) times daily with a meal. Reported on 03/13/2016 10/20/2015: Patient currently using Cinnamon as her diabetes treatment.    No facility-administered encounter medications on file as of 03/13/2016.    Functional Status:  In your present state of health, do you have any difficulty performing the following activities: 03/13/2016 01/30/2016  Hearing? N N  Vision? N N  Difficulty concentrating or making decisions? N N  Walking or  climbing stairs? N N  Dressing or bathing? N N  Doing errands, shopping? N N  Preparing Food and eating ? N N  Using the Toilet? N N  In the past six months, have you accidently leaked urine? N N  Do you have problems with loss of bowel control? N N  Managing your  Medications? N N  Managing your Finances? N N  Housekeeping or managing your Housekeeping? N N    Fall/Depression Screening: PHQ 2/9 Scores 03/13/2016 01/30/2016 01/24/2016 10/14/2015  PHQ - 2 Score 0 0 0 0   Fall Risk  03/13/2016 01/30/2016 01/24/2016 10/14/2015  Falls in the past year? Yes Yes No Yes  Number falls in past yr: 1 1 - 1  Injury with Fall? Yes Yes - Yes  Risk Factor Category  High Fall Risk High Fall Risk - High Fall Risk  Risk for fall due to : History of fall(s);Impaired balance/gait;Impaired mobility History of fall(s);Impaired mobility;Impaired balance/gait - History of fall(s);Impaired balance/gait  Risk for fall due to (comments): - uses a cane - uses a cane  Follow up Falls evaluation completed;Education provided;Falls prevention discussed Falls evaluation completed;Falls prevention discussed - Falls prevention discussed    Assessment and Plan:  Referral Date: 10/06/15 Referral Source: MD: [name] Referral Issues: Home safety, medication management; patient is taking old pills or pills that are not on medication list. Screening Date: 10/13/2014 Case Management start date: 10/14/2015 Program: Diabetes Case closure:  03/13/2016  RN CM advised goals of care met.   RN CM encouraged to continue good DM self management practices.  RN CM praised patient for improved education regarding her condition over the past  6 months.   RN CM advised should new issues arise; notify Dr. Chapman Fitch.  RN CM advised to please notify MD of any changes in condition prior to scheduled appt's.  RN CM provided contact name and # [phone] or main office # [phone] and 24-hour nurse line # [phone].  RN CM confirmed patient is aware of 911 services for urgent emergency needs  RN CM notified [facility] of case closure:  Goals of care met RN CM notified Dr. Chapman Fitch via case closure letter.  RN CM notified patient via case closure letter.   Mariann Laster, RN, BSN, [facility], CCM  [facility]  [employer] Management Coordinator [phone] Direct [phone] Cell [phone] Office [phone] Fax

## 2016-03-22 ENCOUNTER — Other Ambulatory Visit: Payer: Self-pay | Admitting: Family Medicine

## 2016-03-22 DIAGNOSIS — Z1231 Encounter for screening mammogram for malignant neoplasm of breast: Secondary | ICD-10-CM

## 2016-03-29 ENCOUNTER — Ambulatory Visit
Admission: RE | Admit: 2016-03-29 | Discharge: 2016-03-29 | Disposition: A | Discharge status: Home / Self Care | Payer: Medicare Other | Source: Ambulatory Visit | Attending: Family Medicine | Admitting: Family Medicine

## 2016-03-29 DIAGNOSIS — E2839 Other primary ovarian failure: Secondary | ICD-10-CM

## 2016-08-07 ENCOUNTER — Telehealth: Payer: Self-pay | Admitting: Oncology

## 2016-08-07 NOTE — Telephone Encounter (Signed)
Left message re moving 11/1 fu to 11/3.

## 2016-08-08 ENCOUNTER — Ambulatory Visit: Payer: Medicare Other | Admitting: Nurse Practitioner

## 2016-08-16 ENCOUNTER — Other Ambulatory Visit: Payer: Self-pay | Admitting: Oncology

## 2016-08-16 DIAGNOSIS — Z1231 Encounter for screening mammogram for malignant neoplasm of breast: Secondary | ICD-10-CM

## 2016-08-20 ENCOUNTER — Ambulatory Visit: Payer: Medicare Other | Admitting: Oncology

## 2016-08-20 ENCOUNTER — Telehealth: Payer: Self-pay | Admitting: Oncology

## 2016-08-20 NOTE — Telephone Encounter (Signed)
Patient called to cancel 11/13 appointment.

## 2016-09-21 ENCOUNTER — Ambulatory Visit
Admission: RE | Admit: 2016-09-21 | Discharge: 2016-09-21 | Disposition: A | Discharge status: Home / Self Care | Payer: Medicare Other | Source: Ambulatory Visit | Attending: Oncology | Admitting: Oncology

## 2016-09-21 DIAGNOSIS — Z1231 Encounter for screening mammogram for malignant neoplasm of breast: Secondary | ICD-10-CM

## 2016-10-16 DIAGNOSIS — H04123 Dry eye syndrome of bilateral lacrimal glands: Secondary | ICD-10-CM | POA: Diagnosis not present

## 2016-10-16 DIAGNOSIS — H538 Other visual disturbances: Secondary | ICD-10-CM | POA: Diagnosis not present

## 2016-10-16 DIAGNOSIS — H43811 Vitreous degeneration, right eye: Secondary | ICD-10-CM | POA: Diagnosis not present

## 2016-10-16 DIAGNOSIS — H2511 Age-related nuclear cataract, right eye: Secondary | ICD-10-CM | POA: Diagnosis not present

## 2016-10-16 DIAGNOSIS — H52223 Regular astigmatism, bilateral: Secondary | ICD-10-CM | POA: Diagnosis not present

## 2016-12-03 ENCOUNTER — Inpatient Hospital Stay (HOSPITAL_COMMUNITY)
Admission: EM | Admit: 2016-12-03 | Discharge: 2016-12-04 | DRG: 243 | Disposition: A | Discharge status: Skilled Nursing Facility | Payer: Medicare Other | Attending: Cardiology | Admitting: Cardiology

## 2016-12-03 ENCOUNTER — Encounter (HOSPITAL_COMMUNITY)
Admission: EM | Disposition: A | Discharge status: Skilled Nursing Facility | Payer: Self-pay | Source: Home / Self Care | Attending: Cardiology

## 2016-12-03 ENCOUNTER — Encounter (HOSPITAL_COMMUNITY): Payer: Self-pay

## 2016-12-03 DIAGNOSIS — Z884 Allergy status to anesthetic agent status: Secondary | ICD-10-CM

## 2016-12-03 DIAGNOSIS — I442 Atrioventricular block, complete: Secondary | ICD-10-CM | POA: Diagnosis not present

## 2016-12-03 DIAGNOSIS — E785 Hyperlipidemia, unspecified: Secondary | ICD-10-CM | POA: Diagnosis present

## 2016-12-03 DIAGNOSIS — Z79899 Other long term (current) drug therapy: Secondary | ICD-10-CM

## 2016-12-03 DIAGNOSIS — Z881 Allergy status to other antibiotic agents status: Secondary | ICD-10-CM

## 2016-12-03 DIAGNOSIS — Z7984 Long term (current) use of oral hypoglycemic drugs: Secondary | ICD-10-CM | POA: Diagnosis not present

## 2016-12-03 DIAGNOSIS — Z880 Allergy status to penicillin: Secondary | ICD-10-CM

## 2016-12-03 DIAGNOSIS — Z9012 Acquired absence of left breast and nipple: Secondary | ICD-10-CM | POA: Diagnosis not present

## 2016-12-03 DIAGNOSIS — Z853 Personal history of malignant neoplasm of breast: Secondary | ICD-10-CM | POA: Diagnosis not present

## 2016-12-03 DIAGNOSIS — E119 Type 2 diabetes mellitus without complications: Secondary | ICD-10-CM | POA: Diagnosis present

## 2016-12-03 DIAGNOSIS — N179 Acute kidney failure, unspecified: Secondary | ICD-10-CM | POA: Diagnosis not present

## 2016-12-03 DIAGNOSIS — Z95818 Presence of other cardiac implants and grafts: Secondary | ICD-10-CM

## 2016-12-03 DIAGNOSIS — I10 Essential (primary) hypertension: Secondary | ICD-10-CM | POA: Diagnosis not present

## 2016-12-03 DIAGNOSIS — Z7982 Long term (current) use of aspirin: Secondary | ICD-10-CM

## 2016-12-03 DIAGNOSIS — R001 Bradycardia, unspecified: Secondary | ICD-10-CM | POA: Diagnosis present

## 2016-12-03 HISTORY — PX: PACEMAKER IMPLANT: EP1218

## 2016-12-03 HISTORY — DX: Atrioventricular block, complete: I44.2

## 2016-12-03 LAB — BASIC METABOLIC PANEL
ANION GAP: 10 (ref 5–15)
BUN: 24 mg/dL — ABNORMAL HIGH (ref 6–20)
CALCIUM: 9.8 mg/dL (ref 8.9–10.3)
CO2: 26 mmol/L (ref 22–32)
CREATININE: 1.45 mg/dL — AB (ref 0.44–1.00)
Chloride: 102 mmol/L (ref 101–111)
GFR, EST AFRICAN AMERICAN: 36 mL/min — AB (ref >60)
GFR, EST NON AFRICAN AMERICAN: 31 mL/min — AB (ref >60)
GLUCOSE: 192 mg/dL — AB (ref 65–99)
Potassium: 4.3 mmol/L (ref 3.5–5.1)
Sodium: 138 mmol/L (ref 135–145)

## 2016-12-03 LAB — CBC
HCT: 36.2 % (ref 36.0–46.0)
HEMOGLOBIN: 11.8 g/dL — AB (ref 12.0–15.0)
MCH: 30.6 pg (ref 26.0–34.0)
MCHC: 32.6 g/dL (ref 30.0–36.0)
MCV: 94 fL (ref 78.0–100.0)
PLATELETS: 174 10*3/uL (ref 150–400)
RBC: 3.85 MIL/uL — ABNORMAL LOW (ref 3.87–5.11)
RDW: 13.1 % (ref 11.5–15.5)
WBC: 7.8 10*3/uL (ref 4.0–10.5)

## 2016-12-03 LAB — URINALYSIS, ROUTINE W REFLEX MICROSCOPIC
BILIRUBIN URINE: NEGATIVE
Glucose, UA: NEGATIVE mg/dL
HGB URINE DIPSTICK: NEGATIVE
KETONES UR: NEGATIVE mg/dL
Leukocytes, UA: NEGATIVE
NITRITE: NEGATIVE
PROTEIN: NEGATIVE mg/dL
SPECIFIC GRAVITY, URINE: 1.013 (ref 1.005–1.030)
pH: 5 (ref 5.0–8.0)

## 2016-12-03 LAB — I-STAT TROPONIN, ED: TROPONIN I, POC: 0.04 ng/mL (ref 0.00–0.08)

## 2016-12-03 SURGERY — PACEMAKER IMPLANT

## 2016-12-03 MED ORDER — ONDANSETRON HCL 4 MG/2ML IJ SOLN: 4.0000 mg | Freq: Four times a day (QID) | INTRAMUSCULAR | Status: DC | PRN

## 2016-12-03 MED ORDER — IOPAMIDOL (ISOVUE-370) INJECTION 76%
INTRAVENOUS | Status: AC
  Filled 2016-12-03: qty 50

## 2016-12-03 MED ORDER — ASPIRIN 81 MG PO TABS: 81.0000 mg | ORAL_TABLET | Freq: Every day | ORAL | Status: DC

## 2016-12-03 MED ORDER — DIPHENHYDRAMINE HCL 50 MG/ML IJ SOLN
INTRAMUSCULAR | Status: DC | PRN
Start: 2016-12-03 — End: 2016-12-03
  Administered 2016-12-03: 25 mg via INTRAVENOUS

## 2016-12-03 MED ORDER — PRAVASTATIN SODIUM 40 MG PO TABS
40.0000 mg | ORAL_TABLET | Freq: Every day | ORAL | Status: DC
  Administered 2016-12-03: 40 mg via ORAL
  Filled 2016-12-03: qty 1

## 2016-12-03 MED ORDER — SODIUM CHLORIDE 0.9 % IR SOLN
80.0000 mg | Status: AC
  Administered 2016-12-03: 80 mg
  Filled 2016-12-03: qty 2

## 2016-12-03 MED ORDER — VANCOMYCIN HCL IN DEXTROSE 1-5 GM/200ML-% IV SOLN
1000.0000 mg | INTRAVENOUS | Status: AC
  Administered 2016-12-03: 1000 mg via INTRAVENOUS

## 2016-12-03 MED ORDER — LIDOCAINE HCL (PF) 1 % IJ SOLN
INTRAMUSCULAR | Status: DC | PRN
Start: 2016-12-03 — End: 2016-12-03
  Administered 2016-12-03: 45 mL

## 2016-12-03 MED ORDER — VANCOMYCIN HCL IN DEXTROSE 1-5 GM/200ML-% IV SOLN
INTRAVENOUS | Status: AC
  Filled 2016-12-03: qty 200

## 2016-12-03 MED ORDER — HYDRALAZINE HCL 20 MG/ML IJ SOLN
INTRAMUSCULAR | Status: AC
  Filled 2016-12-03: qty 1

## 2016-12-03 MED ORDER — AMLODIPINE BESYLATE 5 MG PO TABS
5.0000 mg | ORAL_TABLET | Freq: Every day | ORAL | Status: DC
  Administered 2016-12-04: 5 mg via ORAL
  Filled 2016-12-03: qty 1

## 2016-12-03 MED ORDER — OCUVITE-LUTEIN PO CAPS
1.0000 | ORAL_CAPSULE | Freq: Every day | ORAL | Status: DC
  Filled 2016-12-03: qty 1

## 2016-12-03 MED ORDER — HEPARIN (PORCINE) IN NACL 2-0.9 UNIT/ML-% IJ SOLN
INTRAMUSCULAR | Status: DC | PRN
  Administered 2016-12-03: 16:00:00

## 2016-12-03 MED ORDER — HEPARIN (PORCINE) IN NACL 2-0.9 UNIT/ML-% IJ SOLN
INTRAMUSCULAR | Status: AC
  Filled 2016-12-03: qty 500

## 2016-12-03 MED ORDER — GLUCOSAMINE-CHONDROITIN 500-400 MG PO TABS: 1.0000 | ORAL_TABLET | Freq: Three times a day (TID) | ORAL | Status: DC

## 2016-12-03 MED ORDER — LIDOCAINE HCL (PF) 1 % IJ SOLN
INTRAMUSCULAR | Status: AC
  Filled 2016-12-03: qty 60

## 2016-12-03 MED ORDER — HYDRALAZINE HCL 20 MG/ML IJ SOLN
INTRAMUSCULAR | Status: DC | PRN
  Administered 2016-12-03: 10 mg via INTRAVENOUS

## 2016-12-03 MED ORDER — DIPHENHYDRAMINE HCL 50 MG/ML IJ SOLN
INTRAMUSCULAR | Status: AC
Start: 2016-12-03 — End: 2016-12-03
  Filled 2016-12-03: qty 1

## 2016-12-03 MED ORDER — IOPAMIDOL (ISOVUE-370) INJECTION 76%
INTRAVENOUS | Status: DC | PRN
  Administered 2016-12-03: 15 mL via INTRAVENOUS

## 2016-12-03 MED ORDER — OCUVITE PO TABS: 1.0000 | ORAL_TABLET | Freq: Every day | ORAL | Status: DC

## 2016-12-03 MED ORDER — SODIUM CHLORIDE 0.9 % IV SOLN: INTRAVENOUS | Status: DC

## 2016-12-03 MED ORDER — ASPIRIN EC 81 MG PO TBEC
81.0000 mg | DELAYED_RELEASE_TABLET | Freq: Every day | ORAL | Status: DC
  Administered 2016-12-03 – 2016-12-04 (×2): 81 mg via ORAL
  Filled 2016-12-03 (×2): qty 1

## 2016-12-03 MED ORDER — COLCHICINE 0.6 MG PO TABS: 0.6000 mg | ORAL_TABLET | Freq: Every day | ORAL | Status: DC

## 2016-12-03 MED ORDER — PROSIGHT PO TABS
1.0000 | ORAL_TABLET | Freq: Every day | ORAL | Status: DC
  Administered 2016-12-03: 1 via ORAL
  Filled 2016-12-03: qty 1

## 2016-12-03 MED ORDER — SODIUM CHLORIDE 0.9 % IR SOLN
Status: AC
  Filled 2016-12-03: qty 2

## 2016-12-03 MED ORDER — FUROSEMIDE 40 MG PO TABS
40.0000 mg | ORAL_TABLET | Freq: Every day | ORAL | Status: DC
  Administered 2016-12-03 – 2016-12-04 (×2): 40 mg via ORAL
  Filled 2016-12-03: qty 1
  Filled 2016-12-03: qty 2

## 2016-12-03 MED ORDER — GABAPENTIN 100 MG PO CAPS
100.0000 mg | ORAL_CAPSULE | Freq: Once | ORAL | Status: AC
  Administered 2016-12-03: 100 mg via ORAL
  Filled 2016-12-03: qty 1

## 2016-12-03 MED ORDER — OLMESARTAN MEDOXOMIL-HCTZ 40-25 MG PO TABS: 1.0000 | ORAL_TABLET | Freq: Every day | ORAL | Status: DC

## 2016-12-03 MED ORDER — CHLORHEXIDINE GLUCONATE 4 % EX LIQD
60.0000 mL | Freq: Once | CUTANEOUS | Status: DC
  Filled 2016-12-03: qty 60

## 2016-12-03 MED ORDER — VANCOMYCIN HCL IN DEXTROSE 1-5 GM/200ML-% IV SOLN
1000.0000 mg | Freq: Two times a day (BID) | INTRAVENOUS | Status: AC
  Administered 2016-12-04: 1000 mg via INTRAVENOUS
  Filled 2016-12-03: qty 200

## 2016-12-03 MED ORDER — GLYBURIDE 5 MG PO TABS: 5.0000 mg | ORAL_TABLET | Freq: Two times a day (BID) | ORAL | Status: DC

## 2016-12-03 MED ORDER — ACETAMINOPHEN 325 MG PO TABS
325.0000 mg | ORAL_TABLET | ORAL | Status: DC | PRN
  Administered 2016-12-04: 650 mg via ORAL
  Filled 2016-12-03: qty 2

## 2016-12-03 MED ORDER — SODIUM CHLORIDE 0.9 % IV SOLN
INTRAVENOUS | Status: DC | PRN
  Administered 2016-12-03: 10 mL/h via INTRAVENOUS

## 2016-12-03 SURGICAL SUPPLY — 7 items
CABLE SURGICAL S-101-97-12 (CABLE) ×3 IMPLANT
LEAD TENDRIL MRI 46CM LPA1200M (Lead) ×3 IMPLANT
LEAD TENDRIL MRI 52CM LPA1200M (Lead) ×3 IMPLANT
PACEMAKER ASSURITY DR-RF (Pacemaker) ×3 IMPLANT
PAD DEFIB LIFELINK (PAD) ×3 IMPLANT
SHEATH CLASSIC 8F (SHEATH) ×6 IMPLANT
TRAY PACEMAKER INSERTION (PACKS) ×3 IMPLANT

## 2016-12-03 NOTE — H&P (Signed)
ELECTROPHYSIOLOGY HISTORY AND PHYSICAL    Patient ID: Alice Cochran MRN: [id number], DOB/AGE: Sep 23, 1924 83 y.o.  Admit date: 12/03/2016 Date of Consult: 12/03/2016  Primary Physician: Cammy Copa, MD Primary Cardiologist: new to [facility] Referring MD: ER MD  Reason for Consultation: complete heart block  HPI:  Alice Cochran is a 83 y.o. female with no significant past medical history significant for diabetes, hypertension, hyperlipidemia, and breast cancer s/p left mastectomy.  She remains independent at home living alone and caring for herself. For the last 2 weeks, she has noticed bradycardia on her home BP monitor with heart rates in the 40's.  She previously had heart rates in the 60's.  She presented to the ER for further evaluation and has been found to be in complete heart block with ventricular escape in the 40's. She has had some increased shortness of breath, LE edema, and "fogginess" per daughter.  She has not had chest pain. Prior to 2 weeks ago, she had no functional limitations.  She has not had prior cardiac testing.   EP has been asked to evaluate for treatment options   Past Medical History:  Diagnosis Date  . Abnormal heart rhythm   . Allergy   . Cancer ([place])   . Diabetes mellitus without complication ([place])   . Hyperlipidemia   . Hypertension      Surgical History:  Past Surgical History:  Procedure Laterality Date  . BREAST SURGERY       Current Outpatient Prescriptions:  .  aspirin 81 MG tablet, Take 81 mg by mouth daily., Disp: , Rfl:  .  beta carotene w/minerals (OCUVITE) tablet, Take 1 tablet by mouth daily., Disp: , Rfl:  .  CINNAMON PO, Take by mouth., Disp: , Rfl:  .  colchicine 0.6 MG tablet, Take 1 tablet (0.6 mg total) by mouth daily., Disp: , Rfl:  .  furosemide (LASIX) 40 MG tablet, Take 40 mg by mouth daily., Disp: , Rfl:  .  Ginkgo Biloba 40 MG TABS, Take by mouth., Disp: , Rfl:  .  glucosamine-chondroitin 500-400 MG tablet,  Take 1 tablet by mouth 3 (three) times daily., Disp: , Rfl:  .  glyBURIDE (DIABETA) 5 MG tablet, Take 5 mg by mouth 2 (two) times daily with a meal. Reported on 03/13/2016, Disp: , Rfl:  .  metroNIDAZOLE (METROGEL) 1 % gel, Apply topically daily., Disp: 45 g, Rfl: 0 .  olmesartan-hydrochlorothiazide (BENICAR HCT) 40-25 MG per tablet, Take 1 tablet by mouth daily., Disp: , Rfl:  .  pravastatin (PRAVACHOL) 40 MG tablet, Take 40 mg by mouth daily., Disp: , Rfl:  .  UNABLE TO FIND, Tumeric tablets, Disp: , Rfl:    Allergies:  Allergies  Allergen Reactions  . Cephalexin Hives  . Novocain [Procaine] Hives  . Penicillins     Hives/rash    Social History   Social History  . Marital status: Single    Spouse name: N/A  . Number of children: N/A  . Years of education: N/A   Occupational History  . Not on file.   Social History Main Topics  . Smoking status: Never Smoker  . Smokeless tobacco: Never Used  . Alcohol use No  . Drug use: No  . Sexual activity: Not on file   Other Topics Concern  . Not on file   Social History Narrative  . No narrative on file     Family History  Problem Relation Age of Onset  . Heart disease Mother   .  Heart disease Father   . Hypertension Brother   . Diabetes Son      Review of Systems: All other systems reviewed and are otherwise negative except as noted above.  Physical Exam: Vitals:   12/03/16 1143 12/03/16 1200 12/03/16 1215 12/03/16 1230  BP: (!) 114/51 (!) 122/44 (!) 129/53 137/68  Pulse: (!) 44 (!) 43 (!) 47 (!) 46  Resp: 18 17 23 16   Temp: 98.7 F (37.1 C)     TempSrc: Oral     SpO2: 100% 100% 100% 100%    GEN- The patient is elderly and thin appearing, alert and oriented x 3 today.   HEENT: normocephalic, atraumatic; sclera clear, conjunctiva pink; hearing intact; oropharynx clear; neck supple  Lungs- Clear to ausculation bilaterally, normal work of breathing.  No wheezes, rales, rhonchi Heart- Regular rate and rhythm GI-  soft, non-tender, non-distended, bowel sounds present  Extremities- no clubbing, cyanosis, 1+ BLE MS- no significant deformity or atrophy Skin- warm and dry, no rash or lesion Psych- euthymic mood, full affect Neuro- strength and sensation are intact  Labs:   Lab Results  Component Value Date   WBC 7.8 12/03/2016   HGB 11.8 (L) 12/03/2016   HCT 36.2 12/03/2016   MCV 94.0 12/03/2016   PLT 174 12/03/2016    Recent Labs Lab 12/03/16 1243  NA 138  K 4.3  CL 102  CO2 26  BUN 24*  CREATININE 1.45*  CALCIUM 9.8  GLUCOSE 192*      Radiology/Studies: No results found.  EKG: complete heart block, ventricular rate 42  TELEMETRY: complete heart block, V rates 40's  Assessment/Plan: 1.  Symptomatic complete heart block The patient has symptomatic complete heart block with no reversible causes identified. Risks, benefits to pacemaker implantation reviewed with patient and daughter who wish to proceed. Kacyn Souder plan at the next available time. She has had prior L mastectomy. Breckin Savannah plan right sided implant With advanced age and no functional limitations prior to onset of bradycardia, I do not feel strongly about further workup prior to device implantation  2.  HTN Stable No change required today  3.  AKI Should improve with restoration of AV synchrony  4.  Diabetes Continue home meds   Dr Curt Bears to see later today   Signed, Chanetta Marshall, NP 12/03/2016 1:38 PM  I have seen and examined this patient with Chanetta Marshall.  Agree with above, note added to reflect my findings.  On exam, regular, bradycardic, no murmurs, lungs clear. Presented to the hospital with shortness of breath, found to be in complete AV block. No reversible causes found. Plan for pacemaker today. Risks and benefits discussed. Risks include but not limited to bleeding, infection, tamponade, pneumothorax. She understands these risks and has agreed to the procedure.    Jayce Boyko M. Anthonyjames Bargar MD 12/03/2016 3:18  PM

## 2016-12-03 NOTE — Discharge Summary (Addendum)
ELECTROPHYSIOLOGY PROCEDURE DISCHARGE SUMMARY    Patient ID: Alice Cochran,  MRN: [id number], DOB/AGE: [birth year] 83 y.o.  Admit date: 12/03/2016 Discharge date: 12/04/2016  Primary Care Physician: Cammy Copa, MD Electrophysiologist: [facility]  Primary Discharge Diagnosis:  Symptomatic complete heart block status post pacemaker implantation this admission  Secondary Discharge Diagnosis:  1.  HTN 2.  Diabetes 3.  Breast cancer s/p left mastectomy   Allergies  Allergen Reactions  . Cephalexin Hives  . Ibuprofen Other (See Comments)    Made patient "feel awful" all over  . Novocain [Procaine] Hives  . Penicillins Hives and Rash    Has patient had a PCN reaction causing immediate rash, facial/tongue/throat swelling, SOB or lightheadedness with hypotension: Yes Has patient had a PCN reaction causing severe rash involving mucus membranes or skin necrosis: No Has patient had a PCN reaction that required hospitalization: No Has patient had a PCN reaction occurring within the last 10 years: No If all of the above answers are "NO", then may proceed with Cephalosporin use.      Procedures This Admission:  1.  Implantation of a STJ dual chamber PPM on 12/03/16 by Dr Curt Bears.  See op note for full details. There were no immediate post procedure complications. 2.  CXR on 12/04/16 demonstrated no pneumothorax status post device implantation.   Brief HPI/Hospital Course:  Alice Cochran is a 83 y.o. female with a past medical history as outlined above. She presented to the ER the day of admission for evaluation of shortness of breath and LE edema. She was found to be in CHB and EP was asked to evaluate.  The patient has had symptomatic bradycardia without reversible causes identified.  Risks, benefits, and alternatives to PPM implantation were reviewed with the patient who wished to proceed.  The patient underwent implantation of a STJ dual chamber PPM with details as outlined  above.  She  was monitored on telemetry overnight which demonstrated sinus rhythm with V pacing.  Left chest was without hematoma or ecchymosis.  The device was interrogated and found to be functioning normally.  CXR was obtained and demonstrated no pneumothorax status post device implantation.  Wound care, arm mobility, and restrictions were reviewed with the patient.  The patient was examined and considered stable for discharge to home.    Physical Exam: Vitals:   12/03/16 2215 12/03/16 2229 12/03/16 2240 12/04/16 0624  BP: (!) 122/43 (!) 154/54 (!) 144/88 (!) 152/59  Pulse: 70  72 65  Resp:    18  Temp:    98.2 F (36.8 C)  TempSrc:    Oral  SpO2: 100%   99%    GEN- The patient is elderly appearing, alert and oriented x 3 today.   HEENT: normocephalic, atraumatic; sclera clear, conjunctiva pink; hearing intact; oropharynx clear; neck supple  Lungs- Clear to ausculation bilaterally, normal work of breathing.  No wheezes, rales, rhonchi Heart- Regular rate and rhythm, no murmurs, rubs or gallops  GI- soft, non-tender, non-distended, bowel sounds present  Extremities- no clubbing, cyanosis, or edema  MS- no significant deformity or atrophy Skin- warm and dry, no rash or lesion, left chest without hematoma/ecchymosis Psych- euthymic mood, full affect Neuro- strength and sensation are intact   Labs:   Lab Results  Component Value Date   WBC 7.8 12/03/2016   HGB 11.8 (L) 12/03/2016   HCT 36.2 12/03/2016   MCV 94.0 12/03/2016   PLT 174 12/03/2016     Recent Labs Lab 12/03/16 1243  NA 138  K 4.3  CL 102  CO2 26  BUN 24*  CREATININE 1.45*  CALCIUM 9.8  GLUCOSE 192*    Discharge Medications:  Allergies as of 12/04/2016      Reactions   Cephalexin Hives   Ibuprofen Other (See Comments)   Made patient "feel awful" all over   Novocain [procaine] Hives   Penicillins Hives, Rash   Has patient had a PCN reaction causing immediate rash, facial/tongue/throat swelling, SOB  or lightheadedness with hypotension: Yes Has patient had a PCN reaction causing severe rash involving mucus membranes or skin necrosis: No Has patient had a PCN reaction that required hospitalization: No Has patient had a PCN reaction occurring within the last 10 years: No If all of the above answers are "NO", then may proceed with Cephalosporin use.      Medication List    TAKE these medications   amLODipine 5 MG tablet Commonly known as:  NORVASC Take 5 mg by mouth daily.   aspirin 81 MG tablet Take 81 mg by mouth daily.   beta carotene w/minerals tablet Take 1 tablet by mouth daily.   CINNAMON PO Take 1 capsule by mouth daily as needed (for elevated sugar levels).   colchicine 0.6 MG tablet Take 1 tablet (0.6 mg total) by mouth daily.   furosemide 40 MG tablet Commonly known as:  LASIX Take 40 mg by mouth daily.   Ginkgo Biloba 40 MG Tabs Take 1 tablet by mouth daily as needed (for memory).   glucosamine-chondroitin 500-400 MG tablet Take 1 tablet by mouth See admin instructions. One to two times a day   glyBURIDE 5 MG tablet Commonly known as:  DIABETA Take 5 mg by mouth 2 (two) times daily with a meal. Reported on 03/13/2016   metroNIDAZOLE 1 % gel Commonly known as:  METROGEL Apply topically daily.   pravastatin 40 MG tablet Commonly known as:  PRAVACHOL Take 40 mg by mouth daily.   RESTASIS 0.05 % ophthalmic emulsion Generic drug:  cycloSPORINE Place 1 drop into both eyes 2 (two) times daily.       Disposition:  Discharge Instructions    Diet - low sodium heart healthy    Complete by:  As directed    Increase activity slowly    Complete by:  As directed      Follow-up Information    [place] Office Follow up on 12/13/2016.   Specialty:  Cardiology Why:  at 11AM for wound check  Contact information: [address] [place] [phone]       Zaiah Eckerson Meredith Leeds, MD Follow up on  03/12/2017.   Specialty:  Cardiology Why:  at 9:30AM  Contact information: [place] [address] [phone]           Duration of Discharge Encounter: Greater than 30 minutes including physician time.  Signed, Chanetta Marshall, NP 12/04/2016 7:08 AM  I have seen and examined this patient with Chanetta Marshall.  Agree with above, note added to reflect my findings.  On exam, regular rhythm, no murmurs, lungs clear. Presented to the hospital yesterday with shortness of breath and fatigue. Found to be in complete heart block. Had a [employer] Jude right-sided dual-chamber pacemaker implanted. Patient tolerated the procedure well without major complications. Did have acute renal failure. Wileen Duncanson need a repeat BMP to assess as outpatient. Chest x-ray and interrogation showed no major N rallies. Plan for discharge home  today.    Jailani Hogans M. Arwin Bisceglia MD 12/04/2016 7:32 AM

## 2016-12-03 NOTE — Progress Notes (Signed)
Patient arrived to 2W12.  Telemetry monitor was applied and CCMD notified.  Patient oriented to unit and room to include phone and call light.  Will continue to monitor.

## 2016-12-03 NOTE — ED Triage Notes (Signed)
Patient here with low HR that she noticed on her BP machine the past 2 days with weakness per daughter. Thinks related to recent BP medicine change 1 mo nth ago. Alert and oriented, NAD

## 2016-12-03 NOTE — Discharge Instructions (Signed)
° ° °  Supplemental Discharge Instructions for  Pacemaker/Defibrillator Patients  Activity No heavy lifting or vigorous activity with your left/right arm for 6 to 8 weeks.  Do not raise your left/right arm above your head for one week.  Gradually raise your affected arm as drawn below.           __      12/07/16                       12/08/16                       12/09/16                   12/10/16  NO DRIVING for 1 week    ; you may begin driving on   [id number]  .  WOUND CARE - Keep the wound area clean and dry.  Do not get this area wet for one week. No showers for one week; you may shower on   12/10/16  . - The tape/steri-strips on your wound will fall off; do not pull them off.  No bandage is needed on the site.  DO  NOT apply any creams, oils, or ointments to the wound area. - If you notice any drainage or discharge from the wound, any swelling or bruising at the site, or you develop a fever > 101? F after you are discharged home, call the office at once.  Special Instructions - You are still able to use cellular telephones; use the ear opposite the side where you have your pacemaker/defibrillator.  Avoid carrying your cellular phone near your device. - When traveling through airports, show security personnel your identification card to avoid being screened in the metal detectors.  Ask the security personnel to use the hand wand. - Avoid arc welding equipment, TENS units (transcutaneous nerve stimulators).  Call the office for questions about other devices. - Avoid electrical appliances that are in poor condition or are not properly grounded. - Microwave ovens are safe to be near or to operate.

## 2016-12-03 NOTE — ED Provider Notes (Addendum)
[facility] DEPT Provider Note   CSN: [id number] Arrival date & time: 12/03/16  1125     History   Chief Complaint No chief complaint on file.   HPI Alice Cochran is a 83 y.o. female.  Patient is a 83 year old female with past history of diabetes, hypertension, and hyperlipidemia. She presents today for evaluation of low heart rate. She reports feeling weak and fatigued and noticed that her heartbeat was beating low for the past 2 weeks. She was at the nephrology clinic today and told to come here to be evaluated. She denies any chest pain. She does report some fatigue and shortness of breath with exertion that has been present for the past 2 weeks. She denies any fevers or chills.   The history is provided by the patient.    Past Medical History:  Diagnosis Date  . Abnormal heart rhythm   . Allergy   . Cancer ([place])   . Diabetes mellitus without complication ([place])   . Hyperlipidemia   . Hypertension     Patient Active Problem List   Diagnosis Date Noted  . Diabetes ([place]) 10/20/2015  . Essential hypertension 10/20/2015  . High cholesterol 10/20/2015  . Gout 10/20/2015  . Breast cancer, left ([place]) 08/12/2015  . Dysuria 07/23/2013    Past Surgical History:  Procedure Laterality Date  . BREAST SURGERY      OB History    No data available       Home Medications    Prior to Admission medications   Medication Sig Start Date End Date Taking? Authorizing Provider  aspirin 81 MG tablet Take 81 mg by mouth daily.    Historical Provider, MD  beta carotene w/minerals (OCUVITE) tablet Take 1 tablet by mouth daily.    Historical Provider, MD  CINNAMON PO Take by mouth.    Historical Provider, MD  colchicine 0.6 MG tablet Take 1 tablet (0.6 mg total) by mouth daily. 08/12/15   Chauncey Cruel, MD  furosemide (LASIX) 40 MG tablet Take 40 mg by mouth daily.    Historical Provider, MD  Ginkgo Biloba 40 MG TABS Take by mouth.    Historical Provider, MD    glucosamine-chondroitin 500-400 MG tablet Take 1 tablet by mouth 3 (three) times daily.    Historical Provider, MD  glyBURIDE (DIABETA) 5 MG tablet Take 5 mg by mouth 2 (two) times daily with a meal. Reported on 03/13/2016    Historical Provider, MD  metroNIDAZOLE (METROGEL) 1 % gel Apply topically daily. 08/04/13   Chauncey Cruel, MD  olmesartan-hydrochlorothiazide (BENICAR HCT) 40-25 MG per tablet Take 1 tablet by mouth daily.    Historical Provider, MD  pravastatin (PRAVACHOL) 40 MG tablet Take 40 mg by mouth daily.    Historical Provider, MD  UNABLE TO FIND Tumeric tablets    Historical Provider, MD    Family History Family History  Problem Relation Age of Onset  . Heart disease Mother   . Heart disease Father   . Hypertension Brother   . Diabetes Son     Social History Social History  Substance Use Topics  . Smoking status: Never Smoker  . Smokeless tobacco: Never Used  . Alcohol use No     Allergies   Cephalexin; Novocain [procaine]; and Penicillins   Review of Systems Review of Systems  All other systems reviewed and are negative.    Physical Exam Updated Vital Signs BP 137/68   Pulse (!) 46   Temp 98.7 F (37.1 C) (  Oral)   Resp 16   SpO2 100%   Physical Exam  Constitutional: She is oriented to person, place, and time. She appears well-developed and well-nourished. No distress.  HENT:  Head: Normocephalic and atraumatic.  Neck: Normal range of motion. Neck supple.  Cardiovascular: Exam reveals no gallop and no friction rub.   No murmur heard. Patient is bradycardic and heartbeat is somewhat irregular  Pulmonary/Chest: Effort normal and breath sounds normal. No respiratory distress. She has no wheezes.  Abdominal: Soft. Bowel sounds are normal. She exhibits no distension. There is no tenderness.  Musculoskeletal: Normal range of motion. She exhibits edema.  There is 1+ pitting edema of both lower extremities  Neurological: She is alert and oriented to  person, place, and time.  Skin: Skin is warm and dry. She is not diaphoretic.  Nursing note and vitals reviewed.    ED Treatments / Results  Labs (all labs ordered are listed, but only abnormal results are displayed) Labs Reviewed  URINALYSIS, [place] PANEL  CBC  I-STAT [place], ED  CBG MONITORING, ED    EKG  EKG Interpretation  Date/Time:  Monday December 03 2016 11:53:13 EST Ventricular Rate:  46 PR Interval:    QRS Duration: 124 QT Interval:  530 QTC Calculation: 463 R Axis:   109 Text Interpretation:  Third degree heart block Left ventricular hypertrophy with QRS widening Cannot rule out Septal infarct , age undetermined Lateral infarct , age undetermined ST & Marked T wave abnormality, consider inferior ischemia Abnormal ECG Confirmed by Nadyne Gariepy  MD, Evva Din ([id number]) on 12/03/2016 12:15:42 PM       Radiology No results found.  Procedures Procedures (including critical care time)  Medications Ordered in ED Medications - No data to display   Initial Impression / Assessment and Plan / ED Course  I have reviewed the triage vital signs and the nursing notes.  Pertinent labs & imaging results that were available during my care of the patient were reviewed by me and considered in my medical decision making (see chart for details).  Patient presents with weakness and low heart rate for the past 2 weeks. She is found today to be in a complete heart block with a ventricular rate of 40s. I have discussed this with cardiology and the patient will be evaluated and likely receive a pacemaker.  While in the emergency department, sheWas closely observed on a cardiac monitor. She has remained  hemodynamically stable and appears well.  CRITICAL CARE Performed by: Veryl Speak Total critical care time: 30 minutes Critical care time was exclusive of separately billable procedures and treating other patients. Critical care was necessary to  treat or prevent imminent or life-threatening deterioration. Critical care was time spent personally by me on the following activities: development of treatment plan with patient and/or surrogate as well as nursing, discussions with consultants, evaluation of patient's response to treatment, examination of patient, obtaining history from patient or surrogate, ordering and performing treatments and interventions, ordering and review of laboratory studies, ordering and review of radiographic studies, pulse oximetry and re-evaluation of patient's condition.   Final Clinical Impressions(s) / ED Diagnoses   Final diagnoses:  None    New Prescriptions New Prescriptions   No medications on file     Veryl Speak, MD 12/03/16 [place], MD 12/03/16 [phone]

## 2016-12-03 NOTE — ED Notes (Signed)
Bedside commode placed in pt's room 

## 2016-12-04 ENCOUNTER — Encounter (HOSPITAL_COMMUNITY): Payer: Self-pay | Admitting: Cardiology

## 2016-12-04 ENCOUNTER — Inpatient Hospital Stay (HOSPITAL_COMMUNITY): Payer: Medicare Other

## 2016-12-04 LAB — GLUCOSE, CAPILLARY: GLUCOSE-CAPILLARY: 187 mg/dL — AB (ref 65–99)

## 2016-12-04 NOTE — Clinical Social Work Placement (Signed)
   CLINICAL SOCIAL WORK PLACEMENT  NOTE  Date:  12/04/2016  Patient Details  Name: Alice Cochran MRN: [id number] Date of Birth: Jul 23, 1924  Clinical Social Work is seeking post-discharge placement for this patient at the [place] level of care (*CSW will initial, date and re-position this form in  chart as items are completed):  Yes   Patient/family provided with [place] Work Department's list of facilities offering this level of care within the geographic area requested by the patient (or if unable, by the patient's family).  Yes   Patient/family informed of their freedom to choose among providers that offer the needed level of care, that participate in Medicare, Medicaid or managed care program needed by the patient, have an available bed and are willing to accept the patient.  Yes   Patient/family informed of [facility] [place]'s ownership interest in [facility] and [facility], as well as of the fact that they are under no obligation to receive care at these facilities.  PASRR submitted to EDS on       PASRR number received on       Existing PASRR number confirmed on       FL2 transmitted to all facilities in geographic area requested by pt/family on       FL2 transmitted to all facilities within larger geographic area on       Patient informed that his/her managed care company has contracts with or will negotiate with certain facilities, including the following:            Patient/family informed of bed offers received.  Patient chooses bed at       Physician recommends and patient chooses bed at      Patient to be transferred to   on  .  Patient to be transferred to facility by       Patient family notified on   of transfer.  Name of family member notified:        PHYSICIAN Please sign FL2     Additional Comment:    _______________________________________________ Wende Neighbors, LCSW 12/04/2016, 11:00 AM

## 2016-12-04 NOTE — Progress Notes (Signed)
Daughter from [place] concerned about patient going home alone.  Will ask PT to see to evaluate safety for discharge.  She has lived at home independently for several years and done well.  Will follow up on PT recommendations.   Chanetta Marshall, NP 12/04/2016 9:17 AM

## 2016-12-04 NOTE — NC FL2 (Signed)
  [place] LEVEL OF CARE SCREENING TOOL     IDENTIFICATION  Patient Name: Alice Cochran Birthdate: 1923-11-06 Sex: female Admission Date (Current Location): 12/03/2016  [facility] [place] Number:  Herbalist and Address:  [facility], [place] [address] [place], [place] [address]      Provider Number: [id number]  Attending Physician Name and Address:  Will Meredith Leeds, MD  Relative Name and Phone Number:  [name] [phone]    Current Level of Care: Hospital Recommended Level of Care: [place] Prior Approval Number:    Date Approved/Denied:   PASRR Number: [id number]  Discharge Plan: SNF    Current Diagnoses: Patient Active Problem List   Diagnosis Date Noted  . Complete heart block ([place]) 12/03/2016  . Bradycardia 12/03/2016  . Diabetes ([place]) 10/20/2015  . Essential hypertension 10/20/2015  . High cholesterol 10/20/2015  . Gout 10/20/2015  . Breast cancer, left ([place]) 08/12/2015  . Dysuria 07/23/2013    Orientation RESPIRATION BLADDER Height & Weight     Self, Time, Situation, Place  Normal Continent Weight:   Height:     BEHAVIORAL SYMPTOMS/MOOD NEUROLOGICAL BOWEL NUTRITION STATUS      Continent Diet (Heart Healthy)  AMBULATORY STATUS COMMUNICATION OF NEEDS Skin   Limited Assist Verbally                         Personal Care Assistance Level of Assistance  Bathing, Feeding, Dressing Bathing Assistance: Limited assistance Feeding assistance: Independent Dressing Assistance: Limited assistance     Functional Limitations Info  Sight, Hearing, Speech Sight Info: Adequate Hearing Info: Adequate Speech Info: Adequate    SPECIAL CARE FACTORS FREQUENCY  PT (By licensed PT), OT (By licensed OT)     PT Frequency: 5x week OT Frequency: 5x week            Contractures Contractures Info: Not present    Additional Factors Info  Code Status, Allergies Code Status Info:  Full Code Allergies Info: CEPHALEXIN, IBUPROFEN, NOVOCAIN PROCAINE, PENICILLINS            Current Medications (12/04/2016):  This is the current hospital active medication list Current Facility-Administered Medications  Medication Dose Route Frequency Provider Last Rate Last Dose  . acetaminophen (TYLENOL) tablet 325-650 mg  325-650 mg Oral Q4H PRN Will Meredith Leeds, MD   650 mg at 12/04/16 0417  . amLODipine (NORVASC) tablet 5 mg  5 mg Oral Daily Will Meredith Leeds, MD   5 mg at 12/04/16 1034  . aspirin EC tablet 81 mg  81 mg Oral Daily Will Meredith Leeds, MD   81 mg at 12/04/16 1033  . furosemide (LASIX) tablet 40 mg  40 mg Oral Daily Will Meredith Leeds, MD   40 mg at 12/04/16 1034  . multivitamin (PROSIGHT) tablet 1 tablet  1 tablet Oral q1800 Will Meredith Leeds, MD   1 tablet at 12/03/16 2225  . ondansetron (ZOFRAN) injection 4 mg  4 mg Intravenous Q6H PRN Will Meredith Leeds, MD      . pravastatin (PRAVACHOL) tablet 40 mg  40 mg Oral q1800 Will Meredith Leeds, MD   40 mg at 12/03/16 2224     Discharge Medications: Please see discharge summary for a list of discharge medications.  Relevant Imaging Results:  Relevant Lab Results:   Additional Information SS#[id number]  Wende Neighbors, LCSW

## 2016-12-04 NOTE — Clinical Social Work Note (Signed)
Clinical Social Work Assessment  Patient Details  Name: Latrica Cochran MRN: [id number] Date of Birth: 11-07-1923  Date of referral:  12/04/16               Reason for consult:  Discharge Planning                Permission sought to share information with:  Family Supports Permission granted to share information::     Name::     Drue Second  Agency::     Relationship::  daughter  Contact Information:  [phone]  Housing/Transportation Living arrangements for the past 2 months:  [place] of Information:  Patient Patient Interpreter Needed:  None Criminal Activity/Legal Involvement Pertinent to Current Situation/Hospitalization:    Significant Relationships:  None Lives with:  Self Do you feel safe going back to the place where you live?  Yes Need for family participation in patient care:  Yes (Comment)  Care giving concerns:  Patient stated she has family support from daughter that lives in the area  Social Worker assessment / plan:  CSW met with patient at bedside to discuss SNF placement. Patient stated she is agreeable to go to SNF but is not familiar with any SNF. Patient asked CSW to contact patients daughter [name]) to see if daughter has a preference. CSW faxed out referral  to SNF in the area. CSW will inform patient of bed offers.  Employment status:  Retired Forensic scientist:  Medicare PT Recommendations:  [place] / Referral to community resources:  [place]  Patient/Family's Response to care:  Patient verbalized appreciation and understanding for CSW role and involvement in care. Patient agreeable with current discharge plan to SNF.   Patient/Family's Understanding of and Emotional Response to Diagnosis, Current Treatment, and Prognosis: Patient with good understading of current medical state and limitations around most recent hospitalization. Patient agreeable with SNF placement in hopes of going  back home Emotional Assessment Appearance:  Appears stated age Attitude/Demeanor/Rapport:  Other Affect (typically observed):  Pleasant Orientation:  Oriented to Situation, Oriented to  Time, Oriented to Place, Oriented to Self Alcohol / Substance use:  Not Applicable Psych involvement (Current and /or in the community):  No (Comment)  Discharge Needs  Concerns to be addressed:  No discharge needs identified Readmission within the last 30 days:  No Current discharge risk:  None Barriers to Discharge:  No Barriers Identified   Wende Neighbors, LCSW 12/04/2016, 10:54 AM

## 2016-12-04 NOTE — Progress Notes (Signed)
Pt has complaints of bilateral leg cramps. Called the MD. New orders given. Pt legs elevated and ice applied.

## 2016-12-04 NOTE — Care Management Note (Signed)
Case Management Note Marvetta Gibbons RN, BSN Unit 2W-Case Manager [phone]  Patient Details  Name: Alice Cochran MRN: [id number] Date of Birth: 14-Sep-1924  Subjective/Objective:   Pt admitted s/p PPM for completed heart block                 Action/Plan: PTA pt lived at home- was independent- per PT eval post procedure- recommendations for STSNF- CSW consulted for placement needs- pt stable for d/c.   Expected Discharge Date:  12/04/16               Expected Discharge Plan:  [place]  In-House Referral:  Clinical Social Work  Discharge planning Services  CM Consult  Post Acute Care Choice:  NA Choice offered to:  NA  DME Arranged:    DME Agency:     HH Arranged:    [place] Agency:     Status of Service:  Completed, signed off  If discussed at [employer] of Stay Meetings, dates discussed:    Discharge Disposition: skilled facility   Additional Comments:  Dawayne Patricia, RN 12/04/2016, 10:33 AM

## 2016-12-04 NOTE — Progress Notes (Signed)
Clinical Social Worker facilitated patient discharge including contacting patient family and facility to confirm patient discharge plans.  Clinical information faxed to facility and family agreeable with plan.  Patients daughter stated that she will transport patient to facility via personal vehicle .  RN Juliann Pulse to call [phone] for report prior to discharge.  Clinical Social Worker will sign off for now as social work intervention is no longer needed. Please consult [place] again if new need arises.  Alice Cochran, MSW, [place]

## 2016-12-04 NOTE — Evaluation (Signed)
Physical Therapy Evaluation Patient Details Name: Alice Cochran MRN: [id number] DOB: January 31, 1924 Today's Date: 12/04/2016   History of Present Illness  Alice Johnsonis a 83 y.o.femalewith no significant past medical history significant for diabetes, hypertension, hyperlipidemia, and breast cancer s/p left mastectomy. She remains independent at home living alone and caring for herself.  She was found ot have a copmlete heart block with ventricular escape in the 40's. Pt s/p pacemaker placement on 2/26.  Clinical Impression  Pt was indep and living home alone PTA, amb with SPC in R UE. Pt now requiring mod/maxA for all mobility and is unable to ambulate at this time. Pt very limited by R UE precautions from pacemaker placement. Pt unsafe to return home alone at this time and would benefit from ST-SNF upon d/c address mentioned deficits and achieve safe mod I level of function for safe transition home alone.    Follow Up Recommendations SNF;Supervision/Assistance - 24 hour    Equipment Recommendations  None recommended by PT (TBD by next venue)    Recommendations for Other Services       Precautions / Restrictions Precautions Precautions: ICD/Pacemaker;Fall Precaution Comments: s/p pacemaker Required Braces or Orthoses: Sling (R UE due to pacemaker ) Restrictions Weight Bearing Restrictions: Yes Other Position/Activity Restrictions: pacemaker precautions on R       Mobility  Bed Mobility Overal bed mobility: Needs Assistance Bed Mobility: Rolling;Sidelying to Sit Rolling: Min assist Sidelying to sit: Min assist       General bed mobility comments: max v/c's for sequencing and to use on L UE not R UE  Transfers Overall transfer level: Needs assistance Equipment used: 1 person hand held assist Transfers: Sit to/from [facility] Sit to Stand: Mod assist;Max assist Stand pivot transfers: Mod assist;Max assist       General transfer comment: pt with  fear of falling due to inability of using R UE. pt retropulsive requiring max v/c's to lean forward and push up with L UE. max v/c's to sequence stepping to chair then to bsc, back to chair. Pt completed 4 sit to stands and 3 stand pvts. Pt very unsteady.  Ambulation/Gait             General Gait Details: unsafe  Stairs            Wheelchair Mobility    Modified Rankin (Stroke Patients Only)       Balance Overall balance assessment: Needs assistance Sitting-balance support: Feet supported;No upper extremity supported Sitting balance-Leahy Scale: Good Sitting balance - Comments: sat EOB x 3 min during MMT without LOB   Standing balance support: Single extremity supported Standing balance-Leahy Scale: Poor Standing balance comment: dependent on physical assist, pt with retropulsive, tactile cues at posterior hips                             Pertinent Vitals/Pain Pain Assessment: No/denies pain    Home Living Family/patient expects to be discharged to:: Skilled nursing facility                 Additional Comments: PTA lived alone; was driving until recently but now takes bus; has 3 stairs to enter home with R railing; does not have anyone to provide assist/supervision upon D/c; has tub shower and was previously sponge bathing due to fear of falling    Prior Function Level of Independence: Independent with assistive device(s)         Comments: use  of SPC on R side, reports difficulty getting in/out of tub so sometimes pt sponge bathed     Hand Dominance   Dominant Hand: Right    Extremity/Trunk Assessment   Upper Extremity Assessment Upper Extremity Assessment: RUE deficits/detail RUE Deficits / Details: under pacemaker precautions, limited shld flex to 90 deg RUE Sensation: decreased light touch (in finger tips)    Lower Extremity Assessment Lower Extremity Assessment: Generalized weakness    Cervical / Trunk Assessment Cervical /  Trunk Assessment: Kyphotic  Communication   Communication: No difficulties  Cognition Arousal/Alertness: Awake/alert Behavior During Therapy: WFL for tasks assessed/performed Overall Cognitive Status: Within Functional Limits for tasks assessed                 General Comments: increased time to recall information regarding PLOF    General Comments General comments (skin integrity, edema, etc.): pacemaker dressing in place    Exercises     Assessment/Plan    PT Assessment Patient needs continued PT services  PT Problem List Decreased strength;Decreased activity tolerance;Decreased balance;Decreased mobility;Decreased knowledge of use of DME;Decreased safety awareness;Decreased knowledge of precautions       PT Treatment Interventions DME instruction;Gait training;Stair training;Functional mobility training;Therapeutic activities;Therapeutic exercise;Balance training    PT Goals (Current goals can be found in the Care Plan section)  Acute Rehab PT Goals Patient Stated Goal: rehab PT Goal Formulation: With patient Time For Goal Achievement: 12/11/16 Potential to Achieve Goals: Good    Frequency Min 3X/week   Barriers to discharge Decreased caregiver support lives alone    Co-evaluation               End of Session Equipment Utilized During Treatment: Gait belt (R UE sling) Activity Tolerance: Patient tolerated treatment well Patient left: in chair;with call bell/phone within reach Nurse Communication: Mobility status PT Visit Diagnosis: Difficulty in walking, not elsewhere classified (R26.2)         Time: [id number] PT Time Calculation (min) (ACUTE ONLY): 31 min   Charges:   PT Evaluation $PT Eval Moderate Complexity: 1 Procedure PT Treatments $Therapeutic Activity: 8-22 mins   PT G Codes:         Joniece Smotherman M Jowell Bossi 12/04/2016, 11:20 AM   Kittie Plater, PT, DPT Pager #: [phone] Office #: [phone]

## 2016-12-04 NOTE — Progress Notes (Signed)
Pt complains of still feeling the need to urinate after voiding. Bladder scan shows 865mls post void. In and out cath per MD order. 919ml out with cath. Post cath bladder scan 0. Will continue to monitor.

## 2016-12-04 NOTE — Progress Notes (Signed)
Pt recommending SNF.  Pt to be discharged to SNF when bed available.   Chanetta Marshall, NP 12/04/2016 1:52 PM

## 2016-12-05 ENCOUNTER — Non-Acute Institutional Stay (SKILLED_NURSING_FACILITY): Payer: Medicare Other | Admitting: Internal Medicine

## 2016-12-05 DIAGNOSIS — I10 Essential (primary) hypertension: Secondary | ICD-10-CM | POA: Diagnosis not present

## 2016-12-05 DIAGNOSIS — E1159 Type 2 diabetes mellitus with other circulatory complications: Secondary | ICD-10-CM

## 2016-12-05 DIAGNOSIS — E1121 Type 2 diabetes mellitus with diabetic nephropathy: Secondary | ICD-10-CM

## 2016-12-05 DIAGNOSIS — Z950 Presence of cardiac pacemaker: Secondary | ICD-10-CM | POA: Diagnosis not present

## 2016-12-05 DIAGNOSIS — I442 Atrioventricular block, complete: Secondary | ICD-10-CM | POA: Diagnosis not present

## 2016-12-05 DIAGNOSIS — I152 Hypertension secondary to endocrine disorders: Secondary | ICD-10-CM

## 2016-12-05 NOTE — Progress Notes (Signed)
12/05/16  Facility; [facility]  Chief complaint; admission to facility for rehabilitation status post stay at [facility] 2/26 through 12/04/16  History; functionally independent 83 year old woman who complained of increasing fatigue over several dayior to admission.She has a automated blood pressure cuff at home and noted thatwhen she did this her pulse rate was in the 30s. She was urged to go to the hospital. In the ER she was found to be in complete heart block. She underwent insertion of a dual-chamber pacemaker. Her troponins were negative. I cannot see that she had an echocardiogram. Chest x-ray showed no pneumothorax.  The patient is a diabetic on oral agents and has hypehe walks with a cane but does not fall. Her daughter helps her with outside activities such as shopping but she is independent with ADLs. Still states that she uses the scat bus independently  BMP Latest Ref Rng & Units 12/03/2016 07/23/2013 12/24/2012  Glucose 65 - 99 mg/dL 192(H) 139 128(H)  BUN 6 - 20 mg/dL 24(H) 20.5 30(H)  Creatinine 0.44 - 1.00 mg/dL 1.45(H) 1.2(H) 1.11(H)  Sodium 135 - 145 mmol/L 138 142 137  Potassium 3.5 - 5.1 mmol/L 4.3 4.2 3.8  Chloride 101 - 111 mmol/L 102 - 103  CO2 22 - 32 mmol/L 26 28 24   Calcium 8.9 - 10.3 mg/dL 9.8 10.1 9.9   CBC Latest Ref Rng & Units 12/03/2016 07/23/2013 12/24/2012  WBC 4.0 - 10.5 K/uL 7.8 5.8 4.8  Hemoglobin 12.0 - 15.0 g/dL 11.8(L) 12.4 11.9(A)  Hematocrit 36.0 - 46.0 % 36.2 37.8 38.5  Platelets 150 - 400 K/uL 174 184 -    Past Medical History:  Diagnosis Date  . Allergy   . Cancer ([place])   . Complete heart block ([place])   . Diabetes mellitus without complication ([place])   . Hyperlipidemia   . Hypertension    Past Surgical History:  Procedure Laterality Date  . BREAST SURGERY    . PACEMAKER IMPLANT N/A 12/03/2016   Procedure: Pacemaker Implant;  Surgeon: Will Meredith Leeds, MD;  Location: [facility];  Service: Cardiovascular;  Laterality:  N/A;    Current Outpatient Prescriptions on File Prior to Visit  Medication Sig Dispense Refill  . amLODipine (NORVASC) 5 MG tablet Take 5 mg by mouth daily.    Marland Kitchen aspirin 81 MG tablet Take 81 mg by mouth daily.    . beta carotene w/minerals (OCUVITE) tablet Take 1 tablet by mouth daily.    Marland Kitchen CINNAMON PO Take 1 capsule by mouth daily as needed (for elevated sugar levels).     . colchicine 0.6 MG tablet Take 1 tablet (0.6 mg total) by mouth daily.    . furosemide (LASIX) 40 MG tablet Take 40 mg by mouth daily.    . Ginkgo Biloba 40 MG TABS Take 1 tablet by mouth daily as needed (for memory).     Marland Kitchen glucosamine-chondroitin 500-400 MG tablet Take 1 tablet by mouth See admin instructions. One to two times a day    . glyBURIDE (DIABETA) 5 MG tablet Take 5 mg by mouth 2 (two) times daily with a meal. Reported on 03/13/2016    . metroNIDAZOLE (METROGEL) 1 % gel Apply topically daily. (Patient not taking: Reported on 12/03/2016) 45 g 0  . pravastatin (PRAVACHOL) 40 MG tablet Take 40 mg by mouth daily.    . RESTASIS 0.05 % ophthalmic emulsion Place 1 drop into both eyes 2 (two) times daily.      Social; reports  that she has never smoked. She has never used smokeless tobacco. She reports that she does not drink alcohol or use drugs.as noted she uses a 4 pronged cane. Does not report falling. Independent with ADLs at home monitors her own blood sugars.  family history includes Diabetes in her son; Heart disease in her father and mother; Hypertension in her brother.   Review of systems; Gen; no weight loss HEENt; no oral pain Respiratory; no shortness of breath, no cough, Cardiac; no exertional chest pain, no palpitations GI; no abnormal pain.states she has been constipated and noted some flutters.States she informed the nurse GU; no dysuria, no voiding  MSK; No joint pain   Physical examination; Vitals;O2 sat is 95% on room air pulse 60 respirations 18 and unlabored. Gen.; the patient does not  appear to be in any distress HEENT; no oral lesions were seen.tongue is coated, come off faily easily. I don't believe ths is thrush Respiratory; clear air entry bilaterally, no wheezing,  Cardiac; S1-S2 normal, no gallops, no murmurs, jugular venous pressure is not elevated.appears to be  Abdomen; lightly distended no liver no spleen no masses Extremities; peripheral  pulses are palpably normal Neurologic; cranial nerves within normal limits, motor strength and tone normal, reflexes normal, plantar responses flexor Mental status; no evidence of cognitive loss or depression  Impression/plan #1 complete heart block status post pacemaker.Everything seems to bee here. I did not review the pacemaker site as it was bandaged. #2 complaining of blood in her stool which may be hemorrhoidal straining stooling. I'll have the nurses observe this. Check her hemoglobin.I did not o a rectal exam today but it may be worthwhile doing before she is discharged #3 type 2 diabetes on oral agents monitor CBGs which the facility #4hypertension; she is on amlodipineand a fairly high dose of Lasix at 40 mg. I'm not completely certain why she requires this. She has no peripheral edema. #5 stage III chronic renal Her creatinine is up versus the last check in Epic albeit that was in 2014 estimated GFR of 36.I wonder whether she needs 40mg  of Lasix and I'm going to reduce this to 20 mg

## 2016-12-06 ENCOUNTER — Encounter: Payer: Self-pay | Admitting: Internal Medicine

## 2016-12-06 ENCOUNTER — Telehealth: Payer: Self-pay | Admitting: Cardiology

## 2016-12-06 NOTE — Telephone Encounter (Signed)
Nurse, Wilhemena Durie, from [facility] calls in for instructions of sling/arm restrictions post PPM implant on Monday by [place]. She asked that I send instructions to fax # [phone].  Instructions faxed explaining arm restrictions

## 2016-12-10 ENCOUNTER — Telehealth: Payer: Self-pay | Admitting: Cardiology

## 2016-12-10 NOTE — Telephone Encounter (Signed)
NP with [facility] called and stated that [facility] staff reported that pt had slurred speech over the weekend. She wanted to know if we would be able to tell if the pt had any arrhythmias. Instructed her how to send a remote transmission w/ the home monitor. Ernst Breach, NP stated that she done a complete Neuro exam on pt and it was normal. transmission not successful. Instructed NP to call [employer] support. She verbalized understanding and is aware and Device Tech RN will call back once the transmission is received.

## 2016-12-11 NOTE — Telephone Encounter (Signed)
Spoke with [facility] Cochran informed her that it was ok with Dr. Curt Bears for her to start an [facility], she voiced understanding.

## 2016-12-11 NOTE — Telephone Encounter (Signed)
Spoke with Ernst Breach informed her that pt had some atrial fib episodes over the weekend, Bridgett asked if Dr. Curt Bears would prescribe an [place] if not she was happy to start one.

## 2016-12-13 ENCOUNTER — Ambulatory Visit: Payer: Medicare Other

## 2016-12-13 DIAGNOSIS — R4781 Slurred speech: Secondary | ICD-10-CM | POA: Insufficient documentation

## 2016-12-14 ENCOUNTER — Encounter (HOSPITAL_COMMUNITY): Payer: Self-pay | Admitting: Emergency Medicine

## 2016-12-14 ENCOUNTER — Encounter: Payer: Self-pay | Admitting: Cardiology

## 2016-12-14 ENCOUNTER — Emergency Department (HOSPITAL_COMMUNITY): Payer: Medicare Other

## 2016-12-14 ENCOUNTER — Emergency Department (HOSPITAL_COMMUNITY)
Admission: EM | Admit: 2016-12-14 | Discharge: 2016-12-15 | Disposition: A | Discharge status: Home / Self Care | Payer: Medicare Other | Attending: Emergency Medicine | Admitting: Emergency Medicine

## 2016-12-14 DIAGNOSIS — Z7982 Long term (current) use of aspirin: Secondary | ICD-10-CM | POA: Insufficient documentation

## 2016-12-14 DIAGNOSIS — R0689 Other abnormalities of breathing: Secondary | ICD-10-CM | POA: Insufficient documentation

## 2016-12-14 DIAGNOSIS — R609 Edema, unspecified: Secondary | ICD-10-CM

## 2016-12-14 DIAGNOSIS — R600 Localized edema: Secondary | ICD-10-CM | POA: Insufficient documentation

## 2016-12-14 DIAGNOSIS — Z950 Presence of cardiac pacemaker: Secondary | ICD-10-CM | POA: Diagnosis not present

## 2016-12-14 DIAGNOSIS — I10 Essential (primary) hypertension: Secondary | ICD-10-CM | POA: Insufficient documentation

## 2016-12-14 DIAGNOSIS — Z79899 Other long term (current) drug therapy: Secondary | ICD-10-CM | POA: Insufficient documentation

## 2016-12-14 DIAGNOSIS — E119 Type 2 diabetes mellitus without complications: Secondary | ICD-10-CM | POA: Diagnosis not present

## 2016-12-14 DIAGNOSIS — Z853 Personal history of malignant neoplasm of breast: Secondary | ICD-10-CM | POA: Diagnosis not present

## 2016-12-14 LAB — I-STAT CHEM 8, ED
BUN: 30 mg/dL — AB (ref 6–20)
CREATININE: 1 mg/dL (ref 0.44–1.00)
Calcium, Ion: 1.17 mmol/L (ref 1.15–1.40)
Chloride: 106 mmol/L (ref 101–111)
Glucose, Bld: 134 mg/dL — ABNORMAL HIGH (ref 65–99)
HEMATOCRIT: 38 % (ref 36.0–46.0)
Hemoglobin: 12.9 g/dL (ref 12.0–15.0)
POTASSIUM: 4.3 mmol/L (ref 3.5–5.1)
Sodium: 143 mmol/L (ref 135–145)
TCO2: 27 mmol/L (ref 0–100)

## 2016-12-14 LAB — CBC WITH DIFFERENTIAL/PLATELET
BASOS ABS: 0 10*3/uL (ref 0.0–0.1)
BASOS PCT: 0 %
Eosinophils Absolute: 0.2 10*3/uL (ref 0.0–0.7)
Eosinophils Relative: 3 %
HEMATOCRIT: 38.2 % (ref 36.0–46.0)
HEMOGLOBIN: 12.4 g/dL (ref 12.0–15.0)
LYMPHS PCT: 26 %
Lymphs Abs: 1.7 10*3/uL (ref 0.7–4.0)
MCH: 31 pg (ref 26.0–34.0)
MCHC: 32.5 g/dL (ref 30.0–36.0)
MCV: 95.5 fL (ref 78.0–100.0)
MONO ABS: 0.5 10*3/uL (ref 0.1–1.0)
MONOS PCT: 8 %
NEUTROS ABS: 4 10*3/uL (ref 1.7–7.7)
NEUTROS PCT: 63 %
Platelets: 250 10*3/uL (ref 150–400)
RBC: 4 MIL/uL (ref 3.87–5.11)
RDW: 13.5 % (ref 11.5–15.5)
WBC: 6.4 10*3/uL (ref 4.0–10.5)

## 2016-12-14 LAB — BRAIN NATRIURETIC PEPTIDE: B NATRIURETIC PEPTIDE 5: 251.6 pg/mL — AB (ref 0.0–100.0)

## 2016-12-14 LAB — I-STAT TROPONIN, ED: Troponin i, poc: 0.02 ng/mL (ref 0.00–0.08)

## 2016-12-14 MED ORDER — FUROSEMIDE 40 MG PO TABS: 40.0000 mg | ORAL_TABLET | Freq: Every day | ORAL | 0 refills | Status: DC

## 2016-12-14 NOTE — ED Notes (Addendum)
Pt reports numbness and no feeling in her feet. Pt's feet are swollen. Pt has hx of diabetes. Pt also is reporting restless leg syndrome.

## 2016-12-14 NOTE — Discharge Instructions (Signed)
Need to increase lasix back to 40mg  for the next 5 days and recheck with facility doctor

## 2016-12-14 NOTE — ED Notes (Signed)
Pt stable, understands discharge instructions, and reasons for return.   

## 2016-12-14 NOTE — ED Notes (Signed)
Patient transported to X-ray 

## 2016-12-14 NOTE — ED Notes (Signed)
Pacemaker interrogated. 

## 2016-12-14 NOTE — ED Triage Notes (Signed)
Per EMS, pt from [facility]. Pt had a pacemaker placed one week ago. Pacing at 74 bpm. Pt reports feeling two big thuds in her chest today for which she was concerned about something wrong with her new pacemaker. EMS VS 157/80, 100% SpO2 Ra.

## 2016-12-14 NOTE — ED Provider Notes (Signed)
[facility] DEPT Provider Note   CSN: [id number] Arrival date & time: 12/14/16  2152     History   Chief Complaint Chief Complaint  Patient presents with  . Pacemaker Problem    HPI [facility] Cochran is a 83 y.o. female.  Patient is a 83 year old female presenting from rehabilitation today after feeling a thump in her chest approximately 30 minutes prior to arrival. Patient recently had a pacemaker placed approximately 1 week ago for complete heart block. She has been at rehabilitation for the last week and has been doing well. She states she was in her normal state of health today when she was talking with a friend and felt a thump in her chest. She denies any chest pain or shortness of breath prior to the event or after. She denies shortness of breath but has noticed over the last multiple days swelling in bilateral lower extremities.   The history is provided by the patient.    Past Medical History:  Diagnosis Date  . Allergy   . Complete heart block ([place])   . Diabetes mellitus without complication ([place])   . HX: breast cancer    L mastectomy  . Hyperlipidemia   . Hypertension     Patient Active Problem List   Diagnosis Date Noted  . Slurred speech 12/13/2016  . Complete heart block ([place]) 12/03/2016  . Bradycardia 12/03/2016  . Diabetes ([place]) 10/20/2015  . Essential hypertension 10/20/2015  . High cholesterol 10/20/2015  . Gout 10/20/2015  . Breast cancer, left ([place]) 08/12/2015  . Dysuria 07/23/2013    Past Surgical History:  Procedure Laterality Date  . BREAST SURGERY    . PACEMAKER IMPLANT N/A 12/03/2016   Procedure: Pacemaker Implant;  Surgeon: Will Meredith Leeds, MD;  Location: [facility];  Service: Cardiovascular;  Laterality: N/A;    OB History    No data available       Home Medications    Prior to Admission medications   Medication Sig Start Date End Date Taking? Authorizing Provider  amLODipine (NORVASC) 5 MG tablet Take 5 mg by  mouth daily. 11/17/16   Historical Provider, MD  aspirin 81 MG tablet Take 81 mg by mouth daily.    Historical Provider, MD  beta carotene w/minerals (OCUVITE) tablet Take 1 tablet by mouth daily.    Historical Provider, MD  CINNAMON PO Take 1 capsule by mouth daily as needed (for elevated sugar levels).     Historical Provider, MD  colchicine 0.6 MG tablet Take 1 tablet (0.6 mg total) by mouth daily. 08/12/15   Chauncey Cruel, MD  furosemide (LASIX) 40 MG tablet Take 40 mg by mouth daily.    Historical Provider, MD  Ginkgo Biloba 40 MG TABS Take 1 tablet by mouth daily as needed (for memory).     Historical Provider, MD  glucosamine-chondroitin 500-400 MG tablet Take 1 tablet by mouth See admin instructions. One to two times a day    Historical Provider, MD  glyBURIDE (DIABETA) 5 MG tablet Take 5 mg by mouth 2 (two) times daily with a meal. Reported on 03/13/2016    Historical Provider, MD  metroNIDAZOLE (METROGEL) 1 % gel Apply topically daily. Patient not taking: Reported on 12/03/2016 08/04/13   Chauncey Cruel, MD  pravastatin (PRAVACHOL) 40 MG tablet Take 40 mg by mouth daily.    Historical Provider, MD  RESTASIS 0.05 % ophthalmic emulsion Place 1 drop into both eyes 2 (two) times daily. 10/10/16   Historical Provider,  MD    Family History Family History  Problem Relation Age of Onset  . Heart disease Mother   . Heart disease Father   . Hypertension Brother   . Diabetes Son     Social History Social History  Substance Use Topics  . Smoking status: Never Smoker  . Smokeless tobacco: Never Used  . Alcohol use No     Allergies   Contrast media [iodinated diagnostic agents]; Cephalexin; Ibuprofen; Novocain [procaine]; Benadryl [diphenhydramine hcl]; and Penicillins   Review of Systems Review of Systems  All other systems reviewed and are negative.    Physical Exam Updated Vital Signs BP 139/80 (BP Location: Right Arm)   Pulse 70   Temp 98.7 F (37.1 C) (Oral)   Resp  20   SpO2 100%   Physical Exam  Constitutional: She is oriented to person, place, and time. She appears well-developed and well-nourished. No distress.  HENT:  Head: Normocephalic and atraumatic.  Mouth/Throat: Oropharynx is clear and moist.  Eyes: Conjunctivae and EOM are normal. Pupils are equal, round, and reactive to light.  Neck: Normal range of motion. Neck supple.  Cardiovascular: Normal rate, regular rhythm and intact distal pulses.   No murmur heard. Pulmonary/Chest: Effort normal and breath sounds normal. No respiratory distress. She has no wheezes. She has no rales.  Pacemaker pocket present in the right upper chest. Steri-Strips intact. No drainage or oozing. No tenderness over the box  Abdominal: Soft. She exhibits no distension. There is no tenderness. There is no rebound and no guarding.  Musculoskeletal: Normal range of motion. She exhibits edema. She exhibits no tenderness.  2+ painting edema in bilateral lower extremities to the midshin  Neurological: She is alert and oriented to person, place, and time.  Skin: Skin is warm and dry. No rash noted. No erythema.  Psychiatric: She has a normal mood and affect. Her behavior is normal.  Nursing note and vitals reviewed.    ED Treatments / Results  Labs (all labs ordered are listed, but only abnormal results are displayed) Labs Reviewed  BRAIN NATRIURETIC PEPTIDE - Abnormal; Notable for the following:       Result Value   B Natriuretic Peptide 251.6 (*)    All other components within normal limits  I-STAT CHEM 8, ED - Abnormal; Notable for the following:    BUN 30 (*)    Glucose, Bld 134 (*)    All other components within normal limits  CBC WITH DIFFERENTIAL/PLATELET  Randolm Idol, ED    EKG  EKG Interpretation  Date/Time:  Friday December 14 2016 22:11:11 EST Ventricular Rate:  70 PR Interval:    QRS Duration: 144 QT Interval:  447 QTC Calculation: 483 R Axis:   -72 Text Interpretation:  ATRIAL SENSING  AND PACING Borderline prolonged PR interval LVH with IVCD, LAD and secondary repol abnrm Inferior infarct, acute (RCA) Lateral leads are also involved Probable RV involvement, suggest recording right precordial leads No significant change since last tracing Confirmed by Maryan Rued  MD, Loree Fee ([id number]) on 12/14/2016 10:26:49 PM       Radiology Dg Chest 2 View  Result Date: 12/14/2016 CLINICAL DATA:  Chest pain, possible pacemaker problem. History of diabetes, hypertension hyperlipidemia LEFT breast cancer. EXAM: CHEST  2 VIEW COMPARISON:  Chest radiograph December 04, 2016 FINDINGS: Two lead RIGHT cardiac pacemaker in situ. Cardiac silhouette is mildly enlarged. Mild chronic bronchitic change without pleural effusion or focal consolidation. No pneumothorax. Status post LEFT mastectomy with surgical clips projecting LEFT  axilla. Osteopenia. IMPRESSION: Stable mild cardiomegaly and chronic bronchitic changes. Electronically Signed   By: Elon Alas M.D.   On: 12/14/2016 22:42    Procedures Procedures (including critical care time)  Medications Ordered in ED Medications - No data to display   Initial Impression / Assessment and Plan / ED Course  I have reviewed the triage vital signs and the nursing notes.  Pertinent labs & imaging results that were available during my care of the patient were reviewed by me and considered in my medical decision making (see chart for details).     Patient isn't independent elderly female who recently had a pacemaker placed for complete heart block. She's been at rehabilitation for the last 1 week and states she's been doing fairly well. This evening she was sitting speaking with a friend when she felt a thump in her chest. She denied feeling bad prior to this episode or after. She did not hear the device make any beeps or other sounds. She currently has no complaints. She states today was a normal day. She has been eating and drinking normally. On exam she is  in no acute distress. Patient was noted to have bilateral pitting edema in the lower extremities.  When asked about this patient states in the last week her legs have started swelling. In reviewing the notes when patient went to rehabilitation the physician who saw her decreased her Lasix to 20 mg daily. This may be the cause of her new edema. She denies any shortness of breath and x-ray shows no signs of pleural effusions or peripheral edema. EKG is unchanged. Patient's pacemaker was interrogated showed no events in the last few days.  11:44 PM Labs reassuring.  Discussed findings with pt and her daughter.  Will have increase lasix back to 40 for the next 4-5 days and f/u with facility physician.    Final Clinical Impressions(s) / ED Diagnoses   Final diagnoses:  Peripheral edema    New Prescriptions New Prescriptions   No medications on file     Blanchie Dessert, MD 12/14/16 2346

## 2016-12-21 LAB — BASIC METABOLIC PANEL
BUN: 25 mg/dL — AB (ref 4–21)
Creatinine: 1 mg/dL (ref 0.5–1.1)
Glucose: 72 mg/dL
Potassium: 4.5 mmol/L (ref 3.4–5.3)
Sodium: 140 mmol/L (ref 137–147)

## 2016-12-21 LAB — CBC AND DIFFERENTIAL
HEMATOCRIT: 39 % (ref 36–46)
HEMOGLOBIN: 12.5 g/dL (ref 12.0–16.0)
PLATELETS: 198 10*3/uL (ref 150–399)
WBC: 5.5 10^3/mL

## 2016-12-31 LAB — BASIC METABOLIC PANEL
BUN: 24 mg/dL — AB (ref 4–21)
CREATININE: 1.1 mg/dL (ref 0.5–1.1)
GLUCOSE: 118 mg/dL
POTASSIUM: 4.5 mmol/L (ref 3.4–5.3)
SODIUM: 141 mmol/L (ref 137–147)

## 2017-01-01 ENCOUNTER — Encounter: Payer: Self-pay | Admitting: Internal Medicine

## 2017-01-01 ENCOUNTER — Non-Acute Institutional Stay (SKILLED_NURSING_FACILITY): Payer: Medicare Other | Admitting: Internal Medicine

## 2017-01-01 DIAGNOSIS — R600 Localized edema: Secondary | ICD-10-CM | POA: Insufficient documentation

## 2017-01-01 DIAGNOSIS — E1159 Type 2 diabetes mellitus with other circulatory complications: Secondary | ICD-10-CM

## 2017-01-01 DIAGNOSIS — R4781 Slurred speech: Secondary | ICD-10-CM | POA: Diagnosis not present

## 2017-01-01 DIAGNOSIS — I480 Paroxysmal atrial fibrillation: Secondary | ICD-10-CM | POA: Diagnosis not present

## 2017-01-01 DIAGNOSIS — I442 Atrioventricular block, complete: Secondary | ICD-10-CM

## 2017-01-01 LAB — HEMOGLOBIN A1C: Hemoglobin A1C: 6.7

## 2017-01-01 NOTE — Assessment & Plan Note (Signed)
Discontinue amlodipine Increase spironolactone to 25 g twice a day

## 2017-01-01 NOTE — Assessment & Plan Note (Addendum)
By report last A1c was a year ago, 6.2% Recheck A1c as a prelude to possible discontinuation of DiaBeta. This agent is associated with hypoglycemia which could mimic TIA

## 2017-01-01 NOTE — Progress Notes (Signed)
This is a nursing facility follow up for specific acute issue of abnormal labs.  Interim medical record and care including review of [employer] notes (3/5, 3/12, & 12/31/16) since last [facility] visit was updated with review of diagnostic studies and change in clinical status since last visit were documented.  HPI: the patient was hospitalized 2/26-2/27/18 after pacemaker implantation for symptomatic complete heart block. The presentation was dyspnea, fatigue and lower extremity edema. Symptoms resolved after pacemaker insertion.   On 3/5 slurred speech was noted by her daughter. The patient did receive Xanax for anxiety. Symptoms apparently resolved later that night. No other neurologic deficits were present. Patient continued to have some intermittent difficult speakig but this was not documented by the nurse practitioner on follow-up & her neuro exam was grossly normal. The pacemaker ws interrogated with confirmation of paroxysmal atrial fibrillation and Eliquis was was initiated.  On 3/10  in she was evaluated in the emergency room for chest pain. There was some question that this was related to some verbal conflict between her daughters. She denies this at this time. There was no evidence of an acute coronary syndrome. Furosemide 40 mg daily for 5 days was initiated because of lowe extremity edema. Because of ongoing anxiety after the pacer implant , Ativan 0.5 mg twice a day was prescribed by psychiatry.  She progressed well with PT/OT , but discharge 3/24 was deferred based on family decision. The patient remains apprehensive about returning home as lives by her self. Spironolactone was added with improvement in edema. Labs 3/26/1 revealed stable kidney function with a creatinine of 1.05 and BUN of 23.9. GFR is 54.14. BNP was significantly elevated at 1209.  Review of systems:  Patient endorses fatigue.  Reports shortness of breath during long sentences.  Patient does  breathing exercise to help with her shortness of breath.  Reports right frontal intermittent dull headache.  No temporal tenderness or pain with eating. No associated visual change. Relieved with Tylenol.   Swelling to lower extremities.  Eye dryness bilaterally, relieved with artificial tears.   Constitutional: No fever, fatigue  Eyes: No discharge, pain, vision change ENT/mouth: No nasal congestion,  purulent discharge, earache,change in hearing ,sore throat  Cardiovascular: No chest pain, palpitations,paroxysmal nocturnal dyspnea, claudication Respiratory: No cough, sputum production,hemoptysis, significant snoring,apnea  Gastrointestinal: No heartburn,dysphagia,abdominal pain, nausea / vomiting,rectal bleeding, melena,change in bowels Genitourinary: No dysuria,hematuria, pyuria,  incontinence, nocturia Musculoskeletal: No joint stiffness, joint swelling, weakness,pain Dermatologic: No rash, pruritus, change in appearance of skin Neurologic: No dizziness, syncope, seizures, numbness , tingling Psychiatric: No significant anxiety , depression, insomnia, anorexia Endocrine: No change in hair/skin/ nails, excessive thirst, excessive hunger, excessive urination  Hematologic/lymphatic: No significant bruising, lymphadenopathy,abnormal bleeding Allergy/immunology: No significant sneezing, urticaria, angioedema  Physical exam:  Pertinent or positive findings: Appears much younger than stated age. Arcus senilis present.  Missing teeth.  Minor rales at the bases. Left arm non-pitting edema (patient reports chronic since left mastectomy) Dressing from pacemaker insertion to right chest, dry sanguinous drainage.  2+ edema to bilateral lower extremities.  Decreased dorsalis pedis and posterior tibial pulses bilaterally.   Flexion contractures of right hand.  General appearance:Adequately nourished; no acute distress , increased work of breathing is present.   Lymphatic: No lymphadenopathy about  the head, neck, axilla . Eyes: No conjunctival inflammation or lid edema is present. There is no scleral icterus. Ears:  External ear exam shows no significant lesions or deformities.   Nose:  External nasal  examination shows no deformity or inflammation. Nasal mucosa are pink and moist without lesions ,exudates Oral exam: lips and gums are healthy appearing.There is no oropharyngeal erythema or exudate . Neck:  No thyromegaly, masses, tenderness noted.    Heart:  Normal rate and regular rhythm. S1 and S2 normal without gallop, murmur, click, rub .  Lungs: without wheezes, rhonchi,rubs. Abdomen:Bowel sounds are normal. Abdomen is soft and nontender with no organomegaly, hernias,masses. GU: deferred  Extremities:  No cyanosis, clubbing Neurologic exam : Cn 2-7 intact Strength equal  in upper & lower extremities Deep tendon reflexes are equal Skin: Warm & dry w/o tenting. No significant lesions or rash.  See summary under each active problem in the Problem List with associated updated therapeutic plan

## 2017-01-01 NOTE — Assessment & Plan Note (Signed)
Psychiatry has ordered Ativan twice a day for 2 weeks  for anxiety.  Reassessmen by psychiatry after Ativan completed  If the patient has recurrent slurred speech , neurology evaluation would be indicated.

## 2017-01-01 NOTE — Patient Instructions (Signed)
See assessment and plan under each diagnosis in the problem list and acutely for this visit 

## 2017-01-01 NOTE — Assessment & Plan Note (Addendum)
-   Continue Eliquis 

## 2017-03-12 ENCOUNTER — Encounter: Payer: Medicare Other | Admitting: Cardiology

## 2017-03-19 ENCOUNTER — Non-Acute Institutional Stay (SKILLED_NURSING_FACILITY): Payer: Medicare Other | Admitting: Internal Medicine

## 2017-03-19 ENCOUNTER — Encounter: Payer: Self-pay | Admitting: Internal Medicine

## 2017-03-19 DIAGNOSIS — I480 Paroxysmal atrial fibrillation: Secondary | ICD-10-CM | POA: Diagnosis not present

## 2017-03-19 DIAGNOSIS — I10 Essential (primary) hypertension: Secondary | ICD-10-CM

## 2017-03-19 DIAGNOSIS — W19XXXD Unspecified fall, subsequent encounter: Secondary | ICD-10-CM

## 2017-03-19 NOTE — Progress Notes (Signed)
NURSING HOME LOCATION:  [facility] ROOM NUMBER:  [id number]  CODE STATUS:  Full Code  PCP:  Aura Dials, MD  [phone] [address]   This is a comprehensive admission note to [facility] performed on this date less than 30 days from date of admission. Included are preadmission medical/surgical history;reconciled medication list; family history; social history and comprehensive review of systems.  Corrections and additions to the records were documented . Comprehensive physical exam was also performed. Additionally a clinical summary was entered for each active diagnosis pertinent to this admission in the Problem List to enhance continuity of care.  HPI: The patient fell several weeks ago in a mechanical fall. When she saw her PCP he referred her here for PT/OT to address  difficult and unstable ambulation due to residual pain. She denies any neurologic or Cardiologic prodrome prior to the fall. Her main concern is that she is not on amlodipine 2.5 mg daily for hypertension and Eliquis 2.5 mg twice a day for paroxysmal fibrillation. She denies any bleeding dyscrasias well on the Eliquise.  The patient had been at the SNF after being hospitalized 2/26-2/27/18 following pacemaker implantation for symptomatically heart block.The patient did have some intermittent dysarthria without other neurologic deficits. She was having paroxysmal fibrillation and Eliquis was initiated. The patient was having ongoing anxiety after pacemaker implant and Ativan 0.5 mg twice daily was prescribed by psychiatry for a period of 2 weeks.The patient had apprehension about returning home by herself; but [facility] reevaluated and discharged her.  Past medical and surgical history;Social history; & Family history reviewed and unchanged.  Review of systems: PT/OT describes some intermittent "full-body tremor". Patient does describe occasional heartburn. Constitutional: No  fever,significant weight change, fatigue  Eyes: No redness, discharge, pain, vision change ENT/mouth: No nasal congestion,  purulent discharge, earache,change in hearing ,sore throat  Cardiovascular: No chest pain, palpitations,paroxysmal nocturnal dyspnea, claudication, edema  Respiratory: No cough, sputum production,hemoptysis, DOE , significant snoring,apnea  Gastrointestinal: No dysphagia,abdominal pain, nausea / vomiting,rectal bleeding, melena,change in bowels Genitourinary: No dysuria,hematuria, pyuria,  incontinence, nocturia Musculoskeletal: No joint stiffness, joint swelling, weakness,pain Dermatologic: No rash, pruritus, change in appearance of skin Neurologic: No dizziness,headache,syncope, seizures, numbness , tingling Psychiatric: No significant anxiety , depression, insomnia, anorexia now Endocrine: No change in hair/skin/ nails, excessive thirst, excessive hunger, excessive urination  Hematologic/lymphatic: No significant bruising, lymphadenopathy,abnormal bleeding Allergy/immunology: No itchy/ watery eyes, significant sneezing, urticaria, angioedema  Physical exam:  Pertinent or positive findings: Patient not wearing her upper partial. She has slight exotropia on the left. Arcus senilis is present. She has 1+ pitting of the extremities. Pedal pulses are decreased. Gait is unsteady, she uses a rolling walker. She has some valgus deformity.  General appearance:Adequately nourished; no acute distress , increased work of breathing is present.   Lymphatic: No lymphadenopathy about the head, neck, axilla . Eyes: No conjunctival inflammation or lid edema is present. There is no scleral icterus. Ears:  External ear exam shows no significant lesions or deformities.   Nose:  External nasal examination shows no deformity or inflammation. Nasal mucosa are pink and moist without lesions ,exudates Oral exam: lips and gums are healthy appearing.There is no oropharyngeal erythema or exudate  . Neck:  No thyromegaly, masses, tenderness noted.    Heart:  Normal rate and regular rhythm. S1 and S2 normal without gallop, murmur, click, rub .  Lungs:Chest clear to auscultation without wheezes, rhonchi,rales , rubs. Abdomen:Bowel sounds are normal. Abdomen is  soft and nontender with no organomegaly, hernias,masses. GU: deferred  Extremities:  No cyanosis, clubbing  Neurologic exam : Strength equal  in upper & lower extremities Balance,Rhomberg,finger to nose testing could not be completed due to clinical state Deep tendon reflexes are equal but decreased Skin: Warm & dry w/o tenting. No significant lesions or rash.  See clinical summary under each active problem in the Problem List with associated updated therapeutic plan

## 2017-03-19 NOTE — Assessment & Plan Note (Signed)
Resume amlodipine 2.5mg daily

## 2017-03-19 NOTE — Assessment & Plan Note (Signed)
Resume Eliquis 2.5 mg twice a day

## 2017-03-19 NOTE — Patient Instructions (Signed)
See assessment and plan under each diagnosis in the problem list and acutely for this visit 

## 2017-03-27 ENCOUNTER — Ambulatory Visit (INDEPENDENT_AMBULATORY_CARE_PROVIDER_SITE_OTHER): Payer: Medicare Other | Admitting: Cardiology

## 2017-03-27 ENCOUNTER — Encounter: Payer: Self-pay | Admitting: Cardiology

## 2017-03-27 VITALS — BP 120/60 | HR 67 | Ht 65.0 in | Wt 165.0 lb

## 2017-03-27 DIAGNOSIS — I442 Atrioventricular block, complete: Secondary | ICD-10-CM

## 2017-03-27 DIAGNOSIS — I10 Essential (primary) hypertension: Secondary | ICD-10-CM

## 2017-03-27 DIAGNOSIS — I480 Paroxysmal atrial fibrillation: Secondary | ICD-10-CM | POA: Diagnosis not present

## 2017-03-27 LAB — CUP PACEART INCLINIC DEVICE CHECK
Battery Remaining Longevity: 122 mo
Battery Voltage: 3.04 V
Brady Statistic RV Percent Paced: 99.37 %
Date Time Interrogation Session: 20180620101330
Implantable Lead Implant Date: 20180226
Implantable Lead Implant Date: 20180226
Implantable Pulse Generator Implant Date: 20180226
Lead Channel Impedance Value: 462.5 Ohm
Lead Channel Impedance Value: 487.5 Ohm
Lead Channel Pacing Threshold Amplitude: 0.75 V
Lead Channel Pacing Threshold Amplitude: 0.75 V
Lead Channel Pacing Threshold Pulse Width: 0.5 ms
Lead Channel Setting Sensing Sensitivity: 2 mV
MDC IDC LEAD LOCATION: 753859
MDC IDC LEAD LOCATION: 753860
MDC IDC MSMT LEADCHNL RA PACING THRESHOLD AMPLITUDE: 0.75 V
MDC IDC MSMT LEADCHNL RA PACING THRESHOLD PULSEWIDTH: 0.5 ms
MDC IDC MSMT LEADCHNL RA SENSING INTR AMPL: 4.7 mV
MDC IDC MSMT LEADCHNL RV PACING THRESHOLD AMPLITUDE: 0.75 V
MDC IDC MSMT LEADCHNL RV PACING THRESHOLD PULSEWIDTH: 0.5 ms
MDC IDC MSMT LEADCHNL RV PACING THRESHOLD PULSEWIDTH: 0.5 ms
MDC IDC MSMT LEADCHNL RV SENSING INTR AMPL: 12 mV
MDC IDC PG SERIAL: 8002175
MDC IDC SET LEADCHNL RA PACING AMPLITUDE: 2 V
MDC IDC SET LEADCHNL RV PACING AMPLITUDE: 0.875
MDC IDC SET LEADCHNL RV PACING PULSEWIDTH: 0.5 ms
MDC IDC STAT BRADY RA PERCENT PACED: 33 %
Pulse Gen Model: 2272

## 2017-03-27 MED ORDER — FUROSEMIDE 40 MG PO TABS: 40.0000 mg | ORAL_TABLET | Freq: Every day | ORAL | 3 refills | Status: DC

## 2017-03-27 NOTE — Patient Instructions (Signed)
Medication Instructions:   Your physician has recommended you make the following change in your medication: 1) INCREASE Lasix to 40 mg daily  --- If you need a refill on your cardiac medications before your next appointment, please call your pharmacy. ---  Labwork:  Your physician recommends that you return for lab work in one week for: BMET  Testing/Procedures:  None ordered  Follow-Up: Remote monitoring is used to monitor your Pacemaker of ICD from home. This monitoring reduces the number of office visits required to check your device to one time per year. It allows [place] to keep an eye on the functioning of your device to ensure it is working properly. You are scheduled for a device check from home on 06/26/2017. You may send your transmission at any time that day. If you have a wireless device, the transmission will be sent automatically. After your physician reviews your transmission, you will receive a postcard with your next transmission date.   Your physician wants you to follow-up in: 6 months with Dr. Curt Bears.  You will receive a reminder letter in the mail two months in advance. If you don't receive a letter, please call our office to schedule the follow-up appointment.  Thank you for choosing [facility]!!   Trinidad Curet, RN [phone]

## 2017-03-27 NOTE — Progress Notes (Signed)
Electrophysiology Office Note   Date:  03/27/2017   ID:  Alice Cochran, DOB Oct 11, 1923, MRN [id number]  PCP:  Alice Dials, MD  Primary Electrophysiologist:  Alice Abramovich Meredith Leeds, MD    Chief Complaint  Patient presents with  . Pacemaker Check    91 days post implants     History of Present Illness: Alice Cochran is a 83 y.o. female who is being seen today for the evaluation of complete AV block at the request of Alice Dials, MD. Presenting today for electrophysiology evaluation. She has a history of diabetes, hypertension, hyperlipidemia, and breast cancer status post left mastectomy. She presented to the hospital with heart rates in the 40s on 12/03/16. She was found to be in complete heart block and had a St. Jude dual-chamber pacemaker placed.  Today, she denies symptoms of palpitations, chest pain, shortness of breath, orthopnea, PND, claudication, dizziness, presyncope, syncope, bleeding, or neurologic sequela. The patient is tolerating medications without difficulties.  She has been having lower extremity edema that has been worse at the end of the day. She says that her feet feel swollen and stiff.   Past Medical History:  Diagnosis Date  . Allergy   . CHF (congestive heart failure) (HCC)    Compensated  . Chronic constipation   . Complete heart block (Alice Cochran)    with Pacer  . Diabetes mellitus without complication (HCC)    Type 2  . HX: breast cancer    L mastectomy  . Hyperlipidemia   . Hypertension   . Osteoarthritis   . PAF (paroxysmal atrial fibrillation) ([place])    12/10/16 Eliquis initiated    Past Surgical History:  Procedure Laterality Date  . BREAST SURGERY    . PACEMAKER IMPLANT N/A 12/03/2016   Procedure: Pacemaker Implant;  Surgeon: Alice Shives Meredith Leeds, MD;  Location: [facility];  Service: Cardiovascular;  Laterality: N/A;     Current Outpatient Prescriptions  Medication Sig Dispense Refill  . amLODipine (NORVASC) 2.5 MG tablet Take  2.5 mg by mouth daily.    . colchicine 0.6 MG tablet Take 0.6 mg by mouth daily.    . cycloSPORINE (RESTASIS) 0.05 % ophthalmic emulsion Place 1 drop into both eyes 2 (two) times daily.    Marland Kitchen ELIQUIS 5 MG TABS tablet Take 5 mg by mouth 2 (two) times daily.    . Glucosamine-Chondroitin 500-250 MG CAPS Take 1 capsule by mouth 2 (two) times daily.    Marland Kitchen glyBURIDE (DIABETA) 5 MG tablet Take 5 mg by mouth 2 (two) times daily with a meal. Reported on 03/13/2016    . pravastatin (PRAVACHOL) 40 MG tablet Take 40 mg by mouth daily.    . furosemide (LASIX) 40 MG tablet Take 1 tablet (40 mg total) by mouth daily. 90 tablet 3   No current facility-administered medications for this visit.     Allergies:   Contrast media [iodinated diagnostic agents]; Cephalexin; Ibuprofen; Novocain [procaine]; Benadryl [diphenhydramine hcl]; and Penicillins   Social History:  The patient  reports that she has never smoked. She has never used smokeless tobacco. She reports that she does not drink alcohol or use drugs.   Family History:  The patient's family history includes Diabetes in her son; Heart disease in her father and mother; Hypertension in her brother.    ROS:  Please see the history of present illness.   Otherwise, review of systems is positive for leg pain, fatigue, DOE, constipation, joint swelling, balance issues.   All other systems  are reviewed and negative.    PHYSICAL EXAM: VS:  BP 120/60   Pulse 67   Ht 5\' 5"  (1.651 m)   Wt 165 lb (74.8 kg)   BMI 27.46 kg/m  , BMI Body mass index is 27.46 kg/m. GEN: Well nourished, well developed, in no acute distress  HEENT: normal  Neck: no JVD, carotid bruits, or masses Cardiac: RRR; no murmurs, rubs, or gallops,no edema  Respiratory:  clear to auscultation bilaterally, normal work of breathing GI: soft, nontender, nondistended, + BS MS: no deformity or atrophy  Skin: warm and dry, device pocket is well healed Neuro:  Strength and sensation are  intact Psych: euthymic mood, full affect  EKG:  EKG is ordered today. Personal review of the ekg ordered shows AV paced   Device interrogation is reviewed today in detail.  See PaceArt for details.   Recent Labs: 12/14/2016: B Natriuretic Peptide 251.6 12/21/2016: Hemoglobin 12.5; Platelets 198 12/31/2016: BUN 24; Creatinine 1.1; Potassium 4.5; Sodium 141    Lipid Panel  No results found for: CHOL, TRIG, HDL, CHOLHDL, VLDL, LDLCALC, LDLDIRECT   Wt Readings from Last 3 Encounters:  03/27/17 165 lb (74.8 kg)  03/19/17 157 lb (71.2 kg)  01/01/17 157 lb (71.2 kg)      Other studies Reviewed: Additional studies/ records that were reviewed today include: Epic notes   ASSESSMENT AND PLAN:  1.  Complete AV block: s/p St. Jude dual chamber pacemaker 12/03/16. Device functioning appropriately. No changes at this time.  2. Hypertension: Well-controlled today. She is having increasing lower extremity edema. We'll increase her Lasix to 40 mg daily. She Alice Cochran need a basic metabolic in one week.  3. Paroxysmal atrial fibrillation: 3.8% on her most recent device interrogation. Continue Eliquis.  This patients CHA2DS2-VASc Score and unadjusted Ischemic Stroke Rate (% per year) is equal to 4.8 % stroke rate/year from a score of 4  Above score calculated as 1 point each if present [CHF, HTN, DM, Vascular=MI/PAD/Aortic Plaque, Age if 65-74, or Female] Above score calculated as 2 points each if present [Age > 75, or Stroke/TIA/TE]   Current medicines are reviewed at length with the patient today.   The patient does not have concerns regarding her medicines.  The following changes were made today:  none  Labs/ tests ordered today include:  Orders Placed This Encounter  Procedures  . EKG 12-Lead     Disposition:   FU with Alice Cochran 6 months  Signed, Alice Krass Meredith Leeds, MD  03/27/2017 9:53 AM     [facility] [address] [place]  [address] [phone] (office) [phone] (fax)

## 2017-04-03 LAB — BASIC METABOLIC PANEL
BUN: 25 — AB (ref 4–21)
Creatinine: 1.1 (ref 0.5–1.1)
GLUCOSE: 73
Potassium: 4.1 (ref 3.4–5.3)
SODIUM: 140 (ref 137–147)

## 2017-04-08 LAB — BASIC METABOLIC PANEL
BUN: 25 — AB (ref 4–21)
CREATININE: 1.2 — AB (ref 0.5–1.1)
Glucose: 101
POTASSIUM: 4.3 (ref 3.4–5.3)
Sodium: 140 (ref 137–147)

## 2017-04-19 ENCOUNTER — Non-Acute Institutional Stay (SKILLED_NURSING_FACILITY): Payer: Medicare Other | Admitting: Adult Health

## 2017-04-19 ENCOUNTER — Other Ambulatory Visit: Payer: Self-pay

## 2017-04-19 ENCOUNTER — Encounter: Payer: Self-pay | Admitting: Adult Health

## 2017-04-19 DIAGNOSIS — I442 Atrioventricular block, complete: Secondary | ICD-10-CM | POA: Diagnosis not present

## 2017-04-19 DIAGNOSIS — M1A9XX0 Chronic gout, unspecified, without tophus (tophi): Secondary | ICD-10-CM | POA: Diagnosis not present

## 2017-04-19 DIAGNOSIS — E1159 Type 2 diabetes mellitus with other circulatory complications: Secondary | ICD-10-CM | POA: Diagnosis not present

## 2017-04-19 DIAGNOSIS — I10 Essential (primary) hypertension: Secondary | ICD-10-CM

## 2017-04-19 DIAGNOSIS — I480 Paroxysmal atrial fibrillation: Secondary | ICD-10-CM | POA: Diagnosis not present

## 2017-04-19 DIAGNOSIS — N183 Chronic kidney disease, stage 3 unspecified: Secondary | ICD-10-CM

## 2017-04-19 DIAGNOSIS — R531 Weakness: Secondary | ICD-10-CM | POA: Diagnosis not present

## 2017-04-19 DIAGNOSIS — M1000 Idiopathic gout, unspecified site: Secondary | ICD-10-CM

## 2017-04-19 DIAGNOSIS — R600 Localized edema: Secondary | ICD-10-CM

## 2017-04-19 DIAGNOSIS — E785 Hyperlipidemia, unspecified: Secondary | ICD-10-CM

## 2017-04-19 NOTE — Progress Notes (Addendum)
DATE:  04/19/2017   MRN:  [id number]  BIRTHDAY: 26-Jun-1924  Facility:  Nursing Home Location:  [facility] Room Number: 086-V  LEVEL OF CARE:  SNF (31)  Contact Information    Name Relation Home Work Mobile   Rodrigez,Ingrid Daughter [phone]         Code Status History    Date Active Date Inactive Code Status Order ID Comments User Context   12/03/2016  5:46 PM 12/04/2016  5:09 PM Full Code [id number]  Constance Haw, MD Inpatient       Chief Complaint  Patient presents with  . Discharge Note    Discharge    HISTORY OF PRESENT ILLNESS:  This is a 78-YO female seen for discharge.  She will discharge to home with Home health PT, OT and Nursing services.    She has been admitted to [facility] from [facility] admission dates 12/03/16 thru 12/04/16 after pacemaker implantation for symptomatic complete heart block. She was started on Eliquis after interrogation of pacemaker confirming PAF.   Patient was admitted to this facility for short-term rehabilitation after the patient's recent hospitalization.  Patient has completed SNF rehabilitation and therapy has cleared the patient for discharge.   PAST MEDICAL HISTORY:  Past Medical History:  Diagnosis Date  . Allergy   . CHF (congestive heart failure) (HCC)    Compensated  . Chronic constipation   . Complete heart block ([place])    with Pacer  . Diabetes mellitus without complication (HCC)    Type 2  . HX: breast cancer    L mastectomy  . Hyperlipidemia   . Hypertension   . Osteoarthritis   . PAF (paroxysmal atrial fibrillation) ([place])    12/10/16 Eliquis initiated      CURRENT MEDICATIONS: Reviewed  Patient's Medications  New Prescriptions   No medications on file  Previous Medications   AMLODIPINE (NORVASC) 2.5 MG TABLET    Take 2.5 mg by mouth daily.   APIXABAN (ELIQUIS) 2.5 MG TABS TABLET    Take 2.5 mg by mouth 2 (two) times daily.   COLCHICINE 0.6 MG TABLET    Take 0.6 mg by mouth daily.   CYCLOSPORINE (RESTASIS) 0.05 % OPHTHALMIC EMULSION    Place 1 drop into both eyes 2 (two) times daily.   FUROSEMIDE (LASIX) 40 MG TABLET    Take 1 tablet (40 mg total) by mouth daily.   GLUCOSAMINE-CHONDROITIN 500-250 MG CAPS    Take 1 capsule by mouth 2 (two) times daily.   GLYBURIDE (DIABETA) 5 MG TABLET    Take 5 mg by mouth 2 (two) times daily with a meal. Reported on 03/13/2016   PRAVASTATIN (PRAVACHOL) 40 MG TABLET    Take 40 mg by mouth daily.  Modified Medications   No medications on file  Discontinued Medications   ELIQUIS 5 MG TABS TABLET    Take 5 mg by mouth 2 (two) times daily.     Allergies  Allergen Reactions  . Contrast Media [Iodinated Diagnostic Agents] Cough  . Cephalexin Hives  . Ibuprofen Other (See Comments)    Made patient "feel awful" all over  . Novocain [Procaine] Hives  . Benadryl [Diphenhydramine Hcl] Other (See Comments)    aggitation  . Penicillins Hives and Rash    Has patient had a PCN reaction causing immediate rash, facial/tongue/throat swelling, SOB or lightheadedness with hypotension: Yes Has patient had a PCN reaction causing severe rash involving mucus membranes  or skin necrosis: No Has patient had a PCN reaction that required hospitalization: No Has patient had a PCN reaction occurring within the last 10 years: No If all of the above answers are "NO", then may proceed with Cephalosporin use.      REVIEW OF SYSTEMS:  GENERAL: no change in appetite, no fatigue, no weight changes, no fever, chills or weakness EYES: Denies change in vision, dry eyes, eye pain, itching or discharge EARS: Denies change in hearing, ringing in ears, or earache NOSE: Denies nasal congestion or epistaxis MOUTH and THROAT: Denies oral discomfort, gingival pain or bleeding, pain from teeth or hoarseness   RESPIRATORY: no cough, SOB, DOE, wheezing, hemoptysis CARDIAC: no chest pain or palpitations GI: no  abdominal pain, diarrhea, constipation, heart burn, nausea or vomiting GU: Denies dysuria, frequency, hematuria, incontinence, or discharge PSYCHIATRIC: Denies feeling of depression or anxiety. No report of hallucinations, insomnia, paranoia, or agitation    PHYSICAL EXAMINATION  GENERAL APPEARANCE: Well nourished. In no acute distress. Normal body habitus SKIN:  Skin is warm and dry.  HEAD: Normal in size and contour. No evidence of trauma EYES: Lids open and close normally. No blepharitis, entropion or ectropion. PERRL. Conjunctivae are clear and sclerae are white. Lenses are without opacity EARS: Pinnae are normal. Patient hears normal voice tunes of the examiner MOUTH and THROAT: Lips are without lesions. Oral mucosa is moist and without lesions. Tongue is normal in shape, size, and color and without lesions NECK: supple, trachea midline, no neck masses, no thyroid tenderness, no thyromegaly LYMPHATICS: no LAN in the neck, no supraclavicular LAN RESPIRATORY: breathing is even & unlabored, BS CTAB CARDIAC: RRR, no murmur,no extra heart sounds, BLE 1+ edema, right chest with pacemaker GI: abdomen soft, normal BS, no masses, no tenderness, no hepatomegaly, no splenomegaly EXTREMITIES:  Able to move X 4 extremities PSYCHIATRIC: Alert and oriented X 3. Affect and behavior are appropriate   LABS/RADIOLOGY: Labs reviewed: Basic Metabolic Panel:  Recent Labs  12/03/16 1243 12/14/16 2309 12/21/16 12/31/16 04/08/17  NA 138 143 140 141 140  K 4.3 4.3 4.5 4.5 4.3  CL 102 106  --   --   --   CO2 26  --   --   --   --   GLUCOSE 192* 134*  --   --   --   BUN 24* 30* 25* 24* 25*  CREATININE 1.45* 1.00 1.0 1.1 1.2*  CALCIUM 9.8  --   --   --   --    CBC:  Recent Labs  12/03/16 1243 12/14/16 2254 12/14/16 2309 12/21/16  WBC 7.8 6.4  --  5.5  NEUTROABS  --  4.0  --   --   HGB 11.8* 12.4 12.9 12.5  HCT 36.2 38.2 38.0 39  MCV 94.0 95.5  --   --   PLT 174 250  --  198    CBG:  Recent Labs  12/04/16 0220  GLUCAP 187*    ASSESSMENT/PLAN:  1. Generalized weakness - for Home health PT and OT, for therapeutic strengthening exercises, fall precautions  2. Complete heart block ([place]) -  S/P Pacemaker implantation on 12/03/16, follow-up with cardiology  3. Paroxysmal atrial fibrillation (HCC) - rate-controlled; continue Eliquis 2.5 mg 1 tab PO BID  4. Lower extremity edema - decrease Lasix from 40 mg to 20 mg 1 tab PO Q D due to elevation in creatinine 1.22  5. Essential hypertension - well-controlled; continue Lasix 40 mg PO Q D and  Amlodipine 2.5 mg PO Q  D  6. Hyperlipidemia, unspecified hyperlipidemia type - continue Pravastatin 40 mg 1 tab PO Q D  7. Type 2 diabetes mellitus with other circulatory complication, without long-term current use of insulin (HCC) - continue Glyburide 5 mg 1 tab PO BID Lab Results  Component Value Date   HGBA1C 6.7 01/01/2017    8. Chronic gout without tophus, unspecified cause, unspecified site - change Colchicine 0.6 mg 1 capsule PO Q D to PRN  9.  Chronic kidney disease, stage 3 - GFR 53.43, will decrease Lasix from 40 mg to 20 mg PO Q D, check BMP in 1 week Lab Results  Component Value Date   CREATININE 1.2 (A) 04/08/2017       I have filled out patient's discharge paperwork and written prescriptions.  Patient will receive home health PT, OT and Nursing.  DME provided:  3-in-1 Bedside commode  Total discharge time: Greater than 30 minutes Greater than 50% was spent in cIs ounseling and coordination of care .  Discharge time involved coordination of the discharge process with social worker, nursing staff and therapy department. Medical justification for home health services verified.   Raelea Gosse C. [place] - NP    [employer] [phone]

## 2017-04-20 LAB — TSH: TSH: 4.72 (ref 0.41–5.90)

## 2017-04-22 MED ORDER — FUROSEMIDE 20 MG PO TABS: 20.0000 mg | ORAL_TABLET | Freq: Every day | ORAL | 0 refills | Status: DC

## 2017-04-22 MED ORDER — COLCHICINE 0.6 MG PO TABS: 0.6000 mg | ORAL_TABLET | Freq: Every day | ORAL | 0 refills | Status: AC | PRN

## 2017-04-22 MED ORDER — PRAVASTATIN SODIUM 40 MG PO TABS: 40.0000 mg | ORAL_TABLET | Freq: Every day | ORAL | 0 refills | Status: AC

## 2017-04-22 MED ORDER — GLYBURIDE 5 MG PO TABS: 5.0000 mg | ORAL_TABLET | Freq: Two times a day (BID) | ORAL | 0 refills | Status: AC

## 2017-04-22 MED ORDER — CYCLOSPORINE 0.05 % OP EMUL: 1.0000 [drp] | Freq: Two times a day (BID) | OPHTHALMIC | 0 refills | Status: AC

## 2017-04-22 MED ORDER — APIXABAN 2.5 MG PO TABS: 2.5000 mg | ORAL_TABLET | Freq: Two times a day (BID) | ORAL | 0 refills | Status: DC

## 2017-04-22 MED ORDER — AMLODIPINE BESYLATE 2.5 MG PO TABS: 2.5000 mg | ORAL_TABLET | Freq: Every day | ORAL | 0 refills | Status: AC

## 2017-04-29 ENCOUNTER — Telehealth: Payer: Self-pay | Admitting: Cardiology

## 2017-04-29 NOTE — Telephone Encounter (Signed)
New message  [name] call requesting to speak with RN. Pt would like to know if she could wear her life alert around her neck with the device implanted in her chest? Please call back to discuss

## 2017-04-29 NOTE — Telephone Encounter (Signed)
Spoke with patients daughter and explained that it was safe to use a life alert device. I additionally confirmed that it was safe to operate a microwave. [facility] medical records number was given for request of an ID card as she was unsure where it might be located.

## 2017-06-05 ENCOUNTER — Other Ambulatory Visit: Payer: Self-pay

## 2017-06-05 MED ORDER — FUROSEMIDE 40 MG PO TABS: 40.0000 mg | ORAL_TABLET | Freq: Every day | ORAL | 1 refills | Status: DC

## 2017-06-14 ENCOUNTER — Telehealth: Payer: Self-pay | Admitting: Cardiology

## 2017-06-14 NOTE — Telephone Encounter (Signed)
Patient daughter called and stated that patient was having some issues. Instructed patient daughter to send a manual transmission. Informed her that once the transmission is received a Chief Operating Officer will review.

## 2017-06-14 NOTE — Telephone Encounter (Signed)
Dtr tells me that pt hasn't been feeling well -- complaining of weakness, fatigue, sleeping a lot and indigestion.  Through further conversation/investigation she tells me that pt is taking Lasix 40 mg daily.  Informed her that according to 7/13 nursing home note pt was to decrease it to 20 mg d/t increased creatinine. Dtr reports no swelling to extremites. Advised that they need to call PCP first thing Monday to discuss concerns and schedule OV w/ PCP.  Advised to hold Lasix until PCP f/u next week.  dtr is agreeable and thanks me for discussing with her and helping her.

## 2017-06-14 NOTE — Telephone Encounter (Signed)
Spoke with patient and patients daughter over the phone. She had several questions regarding her lasix. I explained that I would forward a message to Dr. Macky Lower nurse who would be better able to answer her questions.

## 2017-06-26 ENCOUNTER — Ambulatory Visit (INDEPENDENT_AMBULATORY_CARE_PROVIDER_SITE_OTHER): Payer: Medicare Other | Admitting: *Deleted

## 2017-06-26 DIAGNOSIS — I442 Atrioventricular block, complete: Secondary | ICD-10-CM | POA: Diagnosis not present

## 2017-06-26 NOTE — Progress Notes (Signed)
Remote pacemaker transmission.   

## 2017-06-27 LAB — CUP PACEART REMOTE DEVICE CHECK
Battery Remaining Longevity: 114 mo
Battery Voltage: 3.04 V
Brady Statistic AS VP Percent: 56 %
Brady Statistic AS VS Percent: 1 %
Brady Statistic RA Percent Paced: 39 %
Brady Statistic RV Percent Paced: 97 %
Implantable Lead Implant Date: 20180226
Implantable Lead Implant Date: 20180226
Implantable Lead Location: 753859
Lead Channel Impedance Value: 430 Ohm
Lead Channel Pacing Threshold Amplitude: 0.75 V
Lead Channel Pacing Threshold Pulse Width: 0.5 ms
Lead Channel Pacing Threshold Pulse Width: 0.5 ms
Lead Channel Setting Pacing Amplitude: 0.875
MDC IDC LEAD LOCATION: 753860
MDC IDC MSMT BATTERY REMAINING PERCENTAGE: 95.5 %
MDC IDC MSMT LEADCHNL RA SENSING INTR AMPL: 5 mV
MDC IDC MSMT LEADCHNL RV IMPEDANCE VALUE: 460 Ohm
MDC IDC MSMT LEADCHNL RV PACING THRESHOLD AMPLITUDE: 0.625 V
MDC IDC MSMT LEADCHNL RV SENSING INTR AMPL: 12 mV
MDC IDC PG IMPLANT DT: 20180226
MDC IDC PG SERIAL: 8002175
MDC IDC SESS DTM: 20180919080013
MDC IDC SET LEADCHNL RA PACING AMPLITUDE: 2 V
MDC IDC SET LEADCHNL RV PACING PULSEWIDTH: 0.5 ms
MDC IDC SET LEADCHNL RV SENSING SENSITIVITY: 2 mV
MDC IDC STAT BRADY AP VP PERCENT: 43 %
MDC IDC STAT BRADY AP VS PERCENT: 1.1 %

## 2017-06-28 ENCOUNTER — Encounter: Payer: Self-pay | Admitting: Cardiology

## 2017-07-19 ENCOUNTER — Telehealth: Payer: Self-pay | Admitting: Cardiology

## 2017-07-19 NOTE — Telephone Encounter (Signed)
I spoke with pt. She reports furosemide has been making her tired. Skin feels thin.  Sometimes has a pain in her head.  This has been going on for awhile.  She has been taking 20 mg furosemide since phone call dated 06/14/17.  She used to see Dr. Sheryn Bison but pt reports his office has moved to [facility] and she does not have a primary care doctor at this time.  I am unable to check to see who is now following Dr. March Rummage previous patients as it is after 5 and offices are closed.  I told pt I would forward her message to Dr. Curt Bears.

## 2017-07-19 NOTE — Telephone Encounter (Signed)
New message  Patient calling to get recommendation for PCP and Nephrologist. Patient also states Lasix making her tired. Wants to discuss changing dosage.  Pt c/o medication issue:  1. Name of Medication: furosemide (LASIX) 40 MG tablet(Expired)  2. How are you currently taking this medication (dosage and times per day)?Take 1 tablet (40 mg total) by mouth daily.   3. Are you having a reaction (difficulty breathing--STAT)? no  4. What is your medication issue? Patient states medication is making her tired.

## 2017-07-22 NOTE — Telephone Encounter (Signed)
Pt reports that she has not been to her PCP last month as planned.  States when she called their office she got a pediatric office and then pt stated, "he sees kids now".  She states she didn't get a letter or anything. Offered to try and get in touch w/ physician to see what happened and pt gladly accepted the help in "locating her doctor". Upon calling the "old" number they inform me [facility] office was closed and Dr. Sheryn Bison is now located in [facility] office. I relayed this information to pt and gave her that office phone number in [place] [phone]).   She informs me that Lasix was increased to 40 mg QD while she was in rehab d/t BLEE. States Dr. Sheryn Bison started her on 20 mg yrs ago. Advised pt to reach out to his office to discuss dosing as pt did not know why he started the medication in the first place.  She stated she thought it was for HF but I informed pt she did not have a dx of HF.  She then asks, "then why am I so tired"? She will f/u w/ PCP office to discuss Lasix dosing. I will check in with her next week to see if addressed and if not will discuss w/ Dr. Curt Bears when he returns from vacation next week. Patient verbalized understanding and agreeable to plan.

## 2017-08-15 NOTE — Telephone Encounter (Signed)
Pt tells me she hasn't follow up w/ her PCP. States BLEE improved and she is taking Lasix 20 mg now. States she still has ankle edema.  I reviewed her chart and asked when she started Norvasc. She reports they started it in rehab.  Advised pt she needed to call her PCP tomorrow and update him and discuss switching her to another BP medication if needed.  Educated that her swelling may be a SE of Norvasc.  She appreciates the advice and will call her PCP.

## 2017-09-25 ENCOUNTER — Encounter: Payer: Self-pay | Admitting: Cardiology

## 2017-09-25 ENCOUNTER — Ambulatory Visit (INDEPENDENT_AMBULATORY_CARE_PROVIDER_SITE_OTHER): Payer: Medicare Other | Admitting: Cardiology

## 2017-09-25 ENCOUNTER — Encounter (INDEPENDENT_AMBULATORY_CARE_PROVIDER_SITE_OTHER): Payer: Self-pay

## 2017-09-25 ENCOUNTER — Ambulatory Visit (INDEPENDENT_AMBULATORY_CARE_PROVIDER_SITE_OTHER): Payer: Medicare Other | Admitting: *Deleted

## 2017-09-25 VITALS — BP 124/60 | HR 74 | Ht 67.0 in | Wt 164.2 lb

## 2017-09-25 DIAGNOSIS — I483 Typical atrial flutter: Secondary | ICD-10-CM

## 2017-09-25 DIAGNOSIS — I480 Paroxysmal atrial fibrillation: Secondary | ICD-10-CM

## 2017-09-25 DIAGNOSIS — I442 Atrioventricular block, complete: Secondary | ICD-10-CM

## 2017-09-25 DIAGNOSIS — Z45018 Encounter for adjustment and management of other part of cardiac pacemaker: Secondary | ICD-10-CM | POA: Diagnosis not present

## 2017-09-25 DIAGNOSIS — I10 Essential (primary) hypertension: Secondary | ICD-10-CM | POA: Diagnosis not present

## 2017-09-25 NOTE — Patient Instructions (Signed)
Medication Instructions:  Your physician recommends that you continue on your current medications as directed. Please refer to the Current Medication list given to you today.  *If you need a refill on your cardiac medications before your next appointment, please call your pharmacy*  Labwork: None ordered  Testing/Procedures: None ordered  Follow-Up: Remote monitoring is used to monitor your Pacemaker or ICD from home. This monitoring reduces the number of office visits required to check your device to one time per year. It allows [place] to keep an eye on the functioning of your device to ensure it is working properly. You are scheduled for a device check from home on 12/25/2017. You may send your transmission at any time that day. If you have a wireless device, the transmission will be sent automatically. After your physician reviews your transmission, you will receive a postcard with your next transmission date.  Your physician wants you to follow-up in: 1 year with Dr. Curt Bears.  You will receive a reminder letter in the mail two months in advance. If you don't receive a letter, please call our office to schedule the follow-up appointment.  Thank you for choosing [facility]!!   Trinidad Curet, RN [phone]

## 2017-09-25 NOTE — Progress Notes (Signed)
Electrophysiology Office Note   Date:  09/25/2017   ID:  Alice Cochran, DOB Nov 23, 1923, MRN [id number]  PCP:  Aura Dials, MD  Primary Electrophysiologist:  Will Meredith Leeds, MD    Chief Complaint  Patient presents with  . Pacemaker Check    Complete heart block/PAF     History of Present Illness: Alice Cochran is a 83 y.o. female who is being seen today for the evaluation of complete AV block at the request of Aura Dials, MD. Presenting today for electrophysiology evaluation. She has a history of diabetes, hypertension, hyperlipidemia, and breast cancer status post left mastectomy.  Presented to the hospital on 12/03/16 and was found to be in complete heart block.  She had a Saint Jude dual-chamber pacemaker implanted at that time.  .  Today, denies symptoms of palpitations, chest pain, shortness of breath, orthopnea, PND, lower extremity edema, claudication, dizziness, presyncope, syncope, bleeding, or neurologic sequela. The patient is tolerating medications without difficulties.  She is currently feeling well without major complaint.  Device interrogation shows atrial flutter.  She does not have palpitations, shortness of breath, or weakness.   Past Medical History:  Diagnosis Date  . Allergy   . CHF (congestive heart failure) (HCC)    Compensated  . Chronic constipation   . Complete heart block ([place])    with Pacer  . Diabetes mellitus without complication (HCC)    Type 2  . HX: breast cancer    L mastectomy  . Hyperlipidemia   . Hypertension   . Osteoarthritis   . PAF (paroxysmal atrial fibrillation) ([place])    12/10/16 Eliquis initiated    Past Surgical History:  Procedure Laterality Date  . BREAST SURGERY    . PACEMAKER IMPLANT N/A 12/03/2016   Procedure: Pacemaker Implant;  Surgeon: Will Meredith Leeds, MD;  Location: [facility];  Service: Cardiovascular;  Laterality: N/A;     Current Outpatient Medications  Medication Sig Dispense Refill    . amLODipine (NORVASC) 2.5 MG tablet Take 1 tablet (2.5 mg total) by mouth daily. 30 tablet 0  . apixaban (ELIQUIS) 2.5 MG TABS tablet Take 1 tablet (2.5 mg total) by mouth 2 (two) times daily. 60 tablet 0  . colchicine 0.6 MG tablet Take 1 tablet (0.6 mg total) by mouth daily as needed. 30 tablet 0  . cycloSPORINE (RESTASIS) 0.05 % ophthalmic emulsion Place 1 drop into both eyes 2 (two) times daily. 5.5 mL 0  . Glucosamine-Chondroitin 500-250 MG CAPS Take 1 capsule by mouth 2 (two) times daily.    Marland Kitchen glyBURIDE (DIABETA) 5 MG tablet Take 1 tablet (5 mg total) by mouth 2 (two) times daily with a meal. Reported on 03/13/2016 60 tablet 0  . pravastatin (PRAVACHOL) 40 MG tablet Take 1 tablet (40 mg total) by mouth daily. 30 tablet 0  . furosemide (LASIX) 40 MG tablet Take 1 tablet (40 mg total) by mouth daily. 90 tablet 1   No current facility-administered medications for this visit.     Allergies:   Contrast media [iodinated diagnostic agents]; Cephalexin; Ibuprofen; Novocain [procaine]; Benadryl [diphenhydramine hcl]; and Penicillins   Social History:  The patient  reports that  has never smoked. she has never used smokeless tobacco. She reports that she does not drink alcohol or use drugs.   Family History:  The patient's family history includes Diabetes in her son; Heart disease in her father and mother; Hypertension in her brother.    ROS:  Please see the history of  present illness.   Otherwise, review of systems is positive for leg swelling, joint swelling.   All other systems are reviewed and negative.   PHYSICAL EXAM: VS:  BP 124/60   Pulse 74   Ht 5\' 7"  (1.702 m)   Wt 164 lb 3.2 oz (74.5 kg)   BMI 25.72 kg/m  , BMI Body mass index is 25.72 kg/m. GEN: Well nourished, well developed, in no acute distress  HEENT: normal  Neck: no JVD, carotid bruits, or masses Cardiac: RRR; no murmurs, rubs, or gallops,no edema  Respiratory:  clear to auscultation bilaterally, normal work of  breathing GI: soft, nontender, nondistended, + BS MS: no deformity or atrophy  Skin: warm and dry, device site well healed Neuro:  Strength and sensation are intact Psych: euthymic mood, full affect  EKG:  EKG is not ordered today. Personal review of the ekg ordered 03/27/17 shows AV paced  Personal review of the device interrogation today. Results in [place]: 12/14/2016: B Natriuretic Peptide 251.6 12/21/2016: Hemoglobin 12.5; Platelets 198 04/08/2017: BUN 25; Creatinine 1.2; Potassium 4.3; Sodium 140 04/20/2017: TSH 4.72    Lipid Panel  No results found for: CHOL, TRIG, HDL, CHOLHDL, VLDL, LDLCALC, LDLDIRECT   Wt Readings from Last 3 Encounters:  09/25/17 164 lb 3.2 oz (74.5 kg)  04/19/17 165 lb 6.4 oz (75 kg)  03/27/17 165 lb (74.8 kg)      Other studies Reviewed: Additional studies/ records that were reviewed today include: Epic notes   ASSESSMENT AND PLAN:  1.  Complete AV block: Post Saint Jude dual-chamber pacemaker implanted 12/03/16.  Device functioning appropriately.  No changes at this time.  2. Hypertension: Well-controlled today.  No changes.  3. Paroxysmal atrial fibrillation/atrial flutter: Currently on Eliquis.  Has been in atrial flutter for the last 8 hours.  Patient is asymptomatic.  No changes.  This patients CHA2DS2-VASc Score and unadjusted Ischemic Stroke Rate (% per year) is equal to 4.8 % stroke rate/year from a score of 4  Above score calculated as 1 point each if present [CHF, HTN, DM, Vascular=MI/PAD/Aortic Plaque, Age if 65-74, or Female] Above score calculated as 2 points each if present [Age > 75, or Stroke/TIA/TE]   Current medicines are reviewed at length with the patient today.   The patient does not have concerns regarding her medicines.  The following changes were made today: None  Labs/ tests ordered today include:  No orders of the defined types were placed in this encounter.    Disposition:   FU with Will Camnitz 12  months  Signed, Will Meredith Leeds, MD  09/25/2017 11:07 AM     [place]  [address] [phone] (office) [phone] (fax)

## 2017-09-25 NOTE — Progress Notes (Signed)
Remote pacemaker transmission.   

## 2017-09-26 ENCOUNTER — Encounter: Payer: Self-pay | Admitting: Cardiology

## 2017-09-27 LAB — CUP PACEART REMOTE DEVICE CHECK
Battery Voltage: 3.04 V
Brady Statistic AP VP Percent: 45 %
Brady Statistic AS VP Percent: 54 %
Brady Statistic RA Percent Paced: 42 %
Brady Statistic RV Percent Paced: 97 %
Implantable Lead Implant Date: 20180226
Implantable Lead Location: 753859
Implantable Pulse Generator Implant Date: 20180226
Lead Channel Impedance Value: 460 Ohm
Lead Channel Pacing Threshold Amplitude: 0.75 V
Lead Channel Pacing Threshold Pulse Width: 0.5 ms
Lead Channel Sensing Intrinsic Amplitude: 12 mV
Lead Channel Setting Pacing Amplitude: 2 V
Lead Channel Setting Sensing Sensitivity: 2 mV
MDC IDC LEAD IMPLANT DT: 20180226
MDC IDC LEAD LOCATION: 753860
MDC IDC MSMT BATTERY REMAINING LONGEVITY: 60 mo
MDC IDC MSMT BATTERY REMAINING PERCENTAGE: 95.5 %
MDC IDC MSMT LEADCHNL RA IMPEDANCE VALUE: 450 Ohm
MDC IDC MSMT LEADCHNL RA SENSING INTR AMPL: 5 mV
MDC IDC MSMT LEADCHNL RV PACING THRESHOLD AMPLITUDE: 0.75 V
MDC IDC MSMT LEADCHNL RV PACING THRESHOLD PULSEWIDTH: 0.5 ms
MDC IDC SESS DTM: 20181219124632
MDC IDC SET LEADCHNL RV PACING AMPLITUDE: 5 V
MDC IDC SET LEADCHNL RV PACING PULSEWIDTH: 0.5 ms
MDC IDC STAT BRADY AP VS PERCENT: 1.1 %
MDC IDC STAT BRADY AS VS PERCENT: 1 %
Pulse Gen Serial Number: 8002175

## 2017-09-30 LAB — CUP PACEART INCLINIC DEVICE CHECK
Battery Remaining Longevity: 112 mo
Brady Statistic RV Percent Paced: 97 %
Date Time Interrogation Session: 20181219170223
Implantable Lead Location: 753860
Implantable Pulse Generator Implant Date: 20180226
Lead Channel Impedance Value: 450 Ohm
Lead Channel Pacing Threshold Amplitude: 0.75 V
Lead Channel Setting Pacing Amplitude: 0.75 V
Lead Channel Setting Pacing Pulse Width: 0.5 ms
MDC IDC LEAD IMPLANT DT: 20180226
MDC IDC LEAD IMPLANT DT: 20180226
MDC IDC LEAD LOCATION: 753859
MDC IDC MSMT BATTERY VOLTAGE: 3.04 V
MDC IDC MSMT LEADCHNL RA IMPEDANCE VALUE: 412.5 Ohm
MDC IDC MSMT LEADCHNL RA SENSING INTR AMPL: 3 mV
MDC IDC MSMT LEADCHNL RV PACING THRESHOLD AMPLITUDE: 0.75 V
MDC IDC MSMT LEADCHNL RV PACING THRESHOLD PULSEWIDTH: 0.5 ms
MDC IDC MSMT LEADCHNL RV PACING THRESHOLD PULSEWIDTH: 0.5 ms
MDC IDC MSMT LEADCHNL RV SENSING INTR AMPL: 12 mV
MDC IDC PG SERIAL: 8002175
MDC IDC SET LEADCHNL RA PACING AMPLITUDE: 2 V
MDC IDC SET LEADCHNL RV SENSING SENSITIVITY: 2 mV
MDC IDC STAT BRADY RA PERCENT PACED: 42 %
Pulse Gen Model: 2272

## 2017-11-18 ENCOUNTER — Telehealth: Payer: Self-pay | Admitting: Cardiology

## 2017-11-18 NOTE — Telephone Encounter (Signed)
New message     Please call Alice Cochran , needs to speak to someone about the frequency of the device , due to a phone frequency may interfere with the device

## 2017-11-18 NOTE — Telephone Encounter (Signed)
Spoke with Ms. Alice Cochran informed her that a home phone would not interfere with pt's pacemaker

## 2017-12-02 ENCOUNTER — Other Ambulatory Visit: Payer: Self-pay | Admitting: Cardiology

## 2017-12-25 ENCOUNTER — Ambulatory Visit (INDEPENDENT_AMBULATORY_CARE_PROVIDER_SITE_OTHER): Payer: Medicare Other | Admitting: *Deleted

## 2017-12-25 ENCOUNTER — Telehealth: Payer: Self-pay | Admitting: Cardiology

## 2017-12-25 DIAGNOSIS — I442 Atrioventricular block, complete: Secondary | ICD-10-CM | POA: Diagnosis not present

## 2017-12-25 NOTE — Telephone Encounter (Signed)
Spoke with pt and reminded pt of remote transmission that is due today. Pt verbalized understanding.   

## 2017-12-26 ENCOUNTER — Encounter: Payer: Self-pay | Admitting: Cardiology

## 2017-12-26 NOTE — Progress Notes (Signed)
Remote pacemaker transmission.   

## 2017-12-27 LAB — CUP PACEART REMOTE DEVICE CHECK
Battery Remaining Longevity: 121 mo
Battery Remaining Percentage: 95.5 %
Brady Statistic AP VS Percent: 1 %
Brady Statistic AS VP Percent: 63 %
Brady Statistic AS VS Percent: 1 %
Date Time Interrogation Session: 20190320162019
Implantable Lead Implant Date: 20180226
Implantable Lead Location: 753860
Implantable Pulse Generator Implant Date: 20180226
Lead Channel Impedance Value: 450 Ohm
Lead Channel Pacing Threshold Amplitude: 0.625 V
Lead Channel Pacing Threshold Pulse Width: 0.5 ms
Lead Channel Sensing Intrinsic Amplitude: 12 mV
Lead Channel Sensing Intrinsic Amplitude: 4.7 mV
Lead Channel Setting Pacing Amplitude: 0.875
Lead Channel Setting Pacing Amplitude: 2 V
Lead Channel Setting Pacing Pulse Width: 0.5 ms
MDC IDC LEAD IMPLANT DT: 20180226
MDC IDC LEAD LOCATION: 753859
MDC IDC MSMT BATTERY VOLTAGE: 3.04 V
MDC IDC MSMT LEADCHNL RA PACING THRESHOLD AMPLITUDE: 0.75 V
MDC IDC MSMT LEADCHNL RA PACING THRESHOLD PULSEWIDTH: 0.5 ms
MDC IDC MSMT LEADCHNL RV IMPEDANCE VALUE: 460 Ohm
MDC IDC SET LEADCHNL RV SENSING SENSITIVITY: 2 mV
MDC IDC STAT BRADY AP VP PERCENT: 36 %
MDC IDC STAT BRADY RA PERCENT PACED: 24 %
MDC IDC STAT BRADY RV PERCENT PACED: 92 %
Pulse Gen Serial Number: 8002175

## 2018-02-13 ENCOUNTER — Telehealth: Payer: Self-pay | Admitting: Cardiology

## 2018-02-13 NOTE — Telephone Encounter (Signed)
Spoke with patient who is concerned whether Alice Cochran will interact with her Amlodipine or any of her other medications. Please advise, thank you.

## 2018-02-13 NOTE — Telephone Encounter (Signed)
NEW MESSAGE    Pt c/o medication issue:  1. Name of Medication: "Maryan Char" over the counter  2. How are you currently taking this medication (dosage and times per day)? n/a  3. Are you having a reaction (difficulty breathing--STAT)? n/a  4. What is your medication issue? Patient will no longer be taking amLODipine (NORVASC) 2.5 MG tablet per PCP. Patient wants to know if Maryan Char will be good for her.

## 2018-02-14 NOTE — Telephone Encounter (Signed)
Spoke with patient about Kelley's recommendation in regards to [place] and Eliquis interaction.   She verbalized understanding.

## 2018-02-14 NOTE — Telephone Encounter (Signed)
Alice Cochran may increase her risk of bleeding/bruising with Eliquis. There are no other interactions with her medications.

## 2018-03-17 ENCOUNTER — Telehealth: Payer: Self-pay | Admitting: Cardiology

## 2018-03-17 NOTE — Telephone Encounter (Signed)
Pt's daughter is calling and stating if pt can take a plane ride from [place] to [place]. Please advise. The trip is schedule within the next few weeks. Daughter will be with her mom

## 2018-03-17 NOTE — Telephone Encounter (Signed)
Advised ok to travel. dtr not sure if mom has [employer] identification card -- [employer] number given to contact company. Advised to have patient wanded at the airport and not go through metal detectors. Dtr verbalized understanding and agreeable to plan.

## 2018-03-26 ENCOUNTER — Telehealth: Payer: Self-pay | Admitting: Cardiology

## 2018-03-26 ENCOUNTER — Ambulatory Visit (INDEPENDENT_AMBULATORY_CARE_PROVIDER_SITE_OTHER): Payer: Medicare Other | Admitting: *Deleted

## 2018-03-26 DIAGNOSIS — I442 Atrioventricular block, complete: Secondary | ICD-10-CM

## 2018-03-26 NOTE — Progress Notes (Signed)
Remote pacemaker transmission.   

## 2018-03-26 NOTE — Telephone Encounter (Signed)
Spoke with pt and reminded pt of remote transmission that is due today. Pt verbalized understanding.   

## 2018-04-09 ENCOUNTER — Telehealth: Payer: Self-pay | Admitting: Cardiology

## 2018-04-09 NOTE — Telephone Encounter (Signed)
Called [facility] to inform them that they should request a refill from the prescribing provider. Representative verbalized understanding.

## 2018-04-09 NOTE — Telephone Encounter (Signed)
Pt needs to contact prescribing provider for refills. Thanks

## 2018-04-09 NOTE — Telephone Encounter (Signed)
Pt's pharmacy is requesting a refill on Amlodipine. I do not see where Dr. Curt Bears prescribed this medication. Would Dr. Curt Bears like to refill this medication? Please address

## 2018-04-15 LAB — CUP PACEART REMOTE DEVICE CHECK
Battery Remaining Longevity: 120 mo
Battery Remaining Percentage: 95.5 %
Battery Voltage: 3.02 V
Brady Statistic AP VS Percent: 1 %
Brady Statistic AS VS Percent: 1 %
Brady Statistic RA Percent Paced: 27 %
Brady Statistic RV Percent Paced: 92 %
Date Time Interrogation Session: 20190619175600
Implantable Lead Implant Date: 20180226
Implantable Lead Location: 753859
Implantable Pulse Generator Implant Date: 20180226
Lead Channel Impedance Value: 440 Ohm
Lead Channel Pacing Threshold Amplitude: 0.625 V
Lead Channel Pacing Threshold Amplitude: 0.75 V
Lead Channel Pacing Threshold Pulse Width: 0.5 ms
Lead Channel Setting Pacing Amplitude: 2 V
MDC IDC LEAD IMPLANT DT: 20180226
MDC IDC LEAD LOCATION: 753860
MDC IDC MSMT LEADCHNL RA SENSING INTR AMPL: 4.2 mV
MDC IDC MSMT LEADCHNL RV IMPEDANCE VALUE: 450 Ohm
MDC IDC MSMT LEADCHNL RV PACING THRESHOLD PULSEWIDTH: 0.5 ms
MDC IDC MSMT LEADCHNL RV SENSING INTR AMPL: 12 mV
MDC IDC SET LEADCHNL RV PACING AMPLITUDE: 0.875
MDC IDC SET LEADCHNL RV PACING PULSEWIDTH: 0.5 ms
MDC IDC SET LEADCHNL RV SENSING SENSITIVITY: 2 mV
MDC IDC STAT BRADY AP VP PERCENT: 40 %
MDC IDC STAT BRADY AS VP PERCENT: 58 %
Pulse Gen Model: 2272
Pulse Gen Serial Number: 8002175

## 2018-04-17 IMAGING — DX DG CHEST 2V
2 series · 2 of 2 positions shown · non-contrast
Comparison: None.

CLINICAL DATA: Status post pacemaker placement

EXAM:
CHEST  2 VIEW

[chest lat]
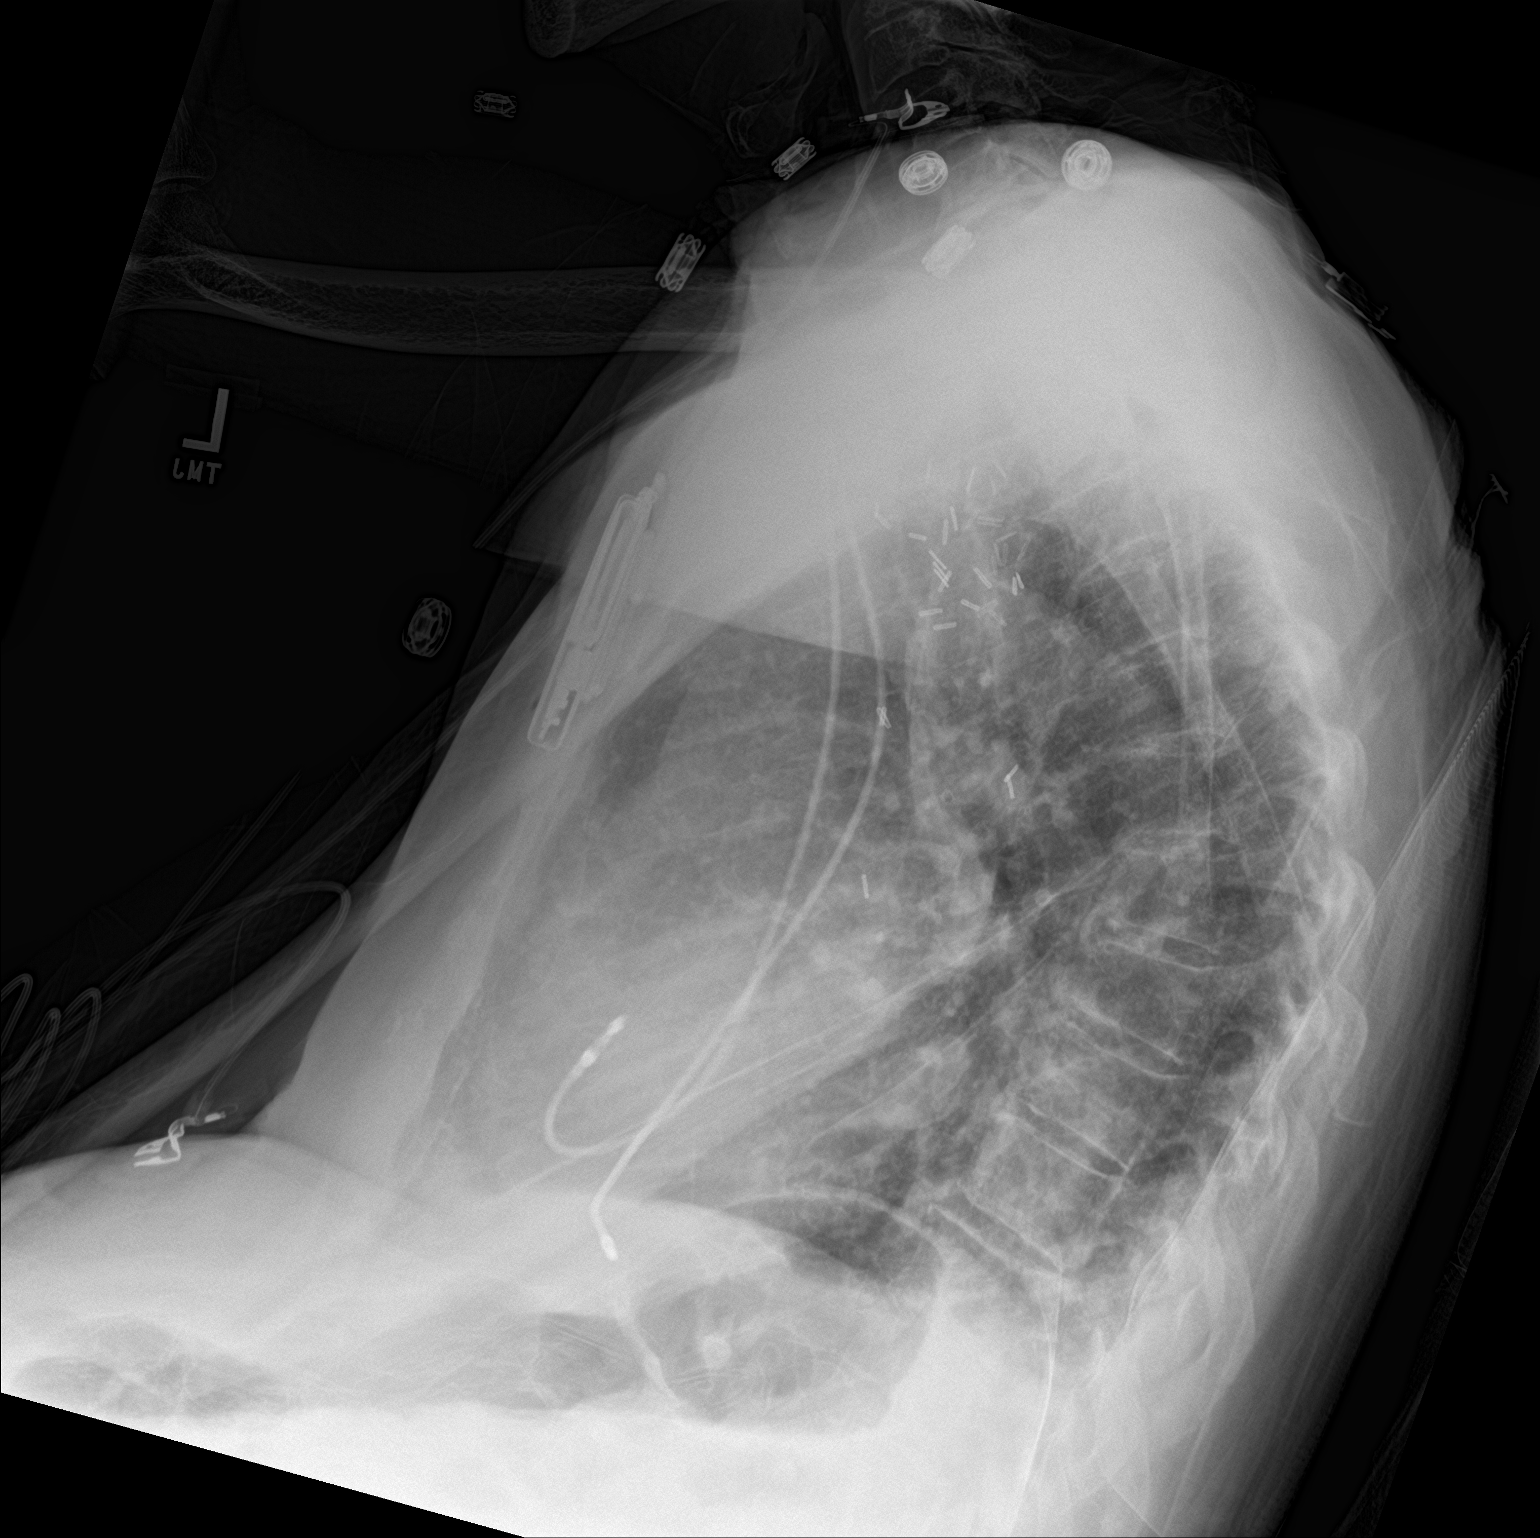

[chest ap]
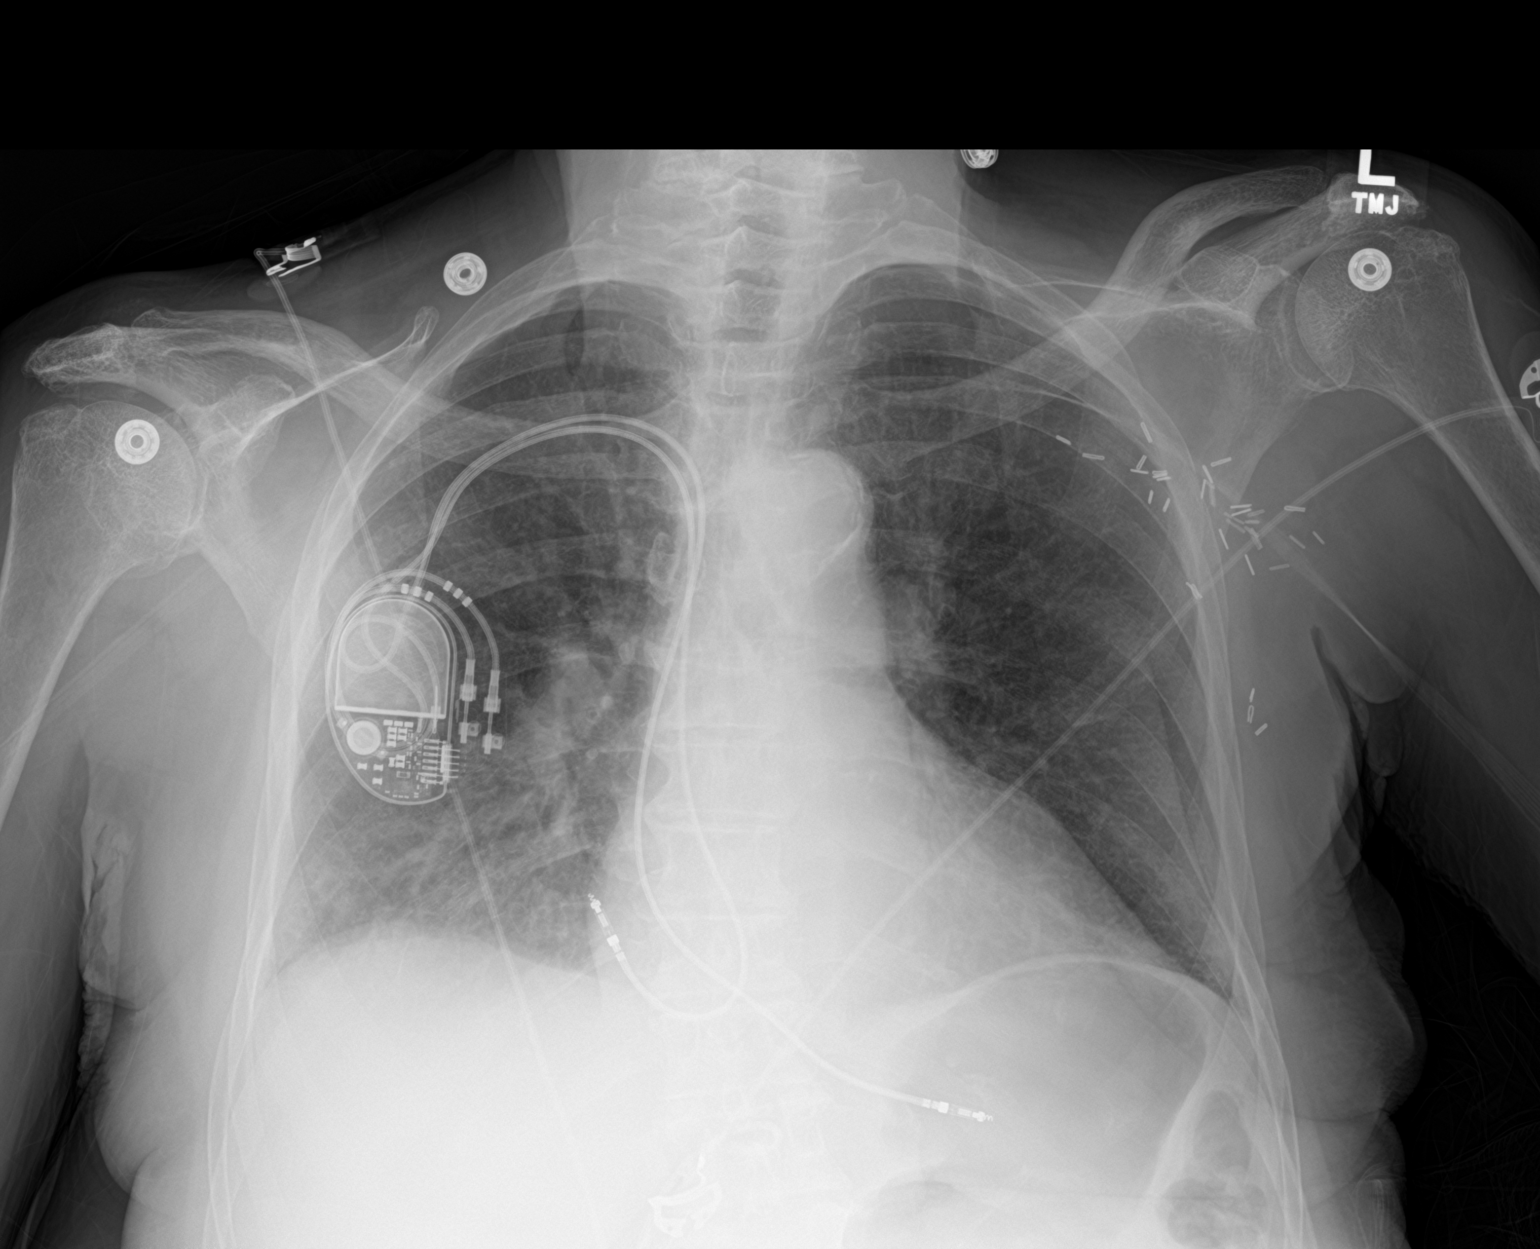

[2 of 2 positions shown; findings below may reference images not displayed]

FINDINGS: Cardiac shadow is at the upper limits of normal in size. A pacing
device is noted in satisfactory position. No pneumothorax is seen.
The lungs are well aerated bilaterally with patchy changes in the
right lower lobe. Postsurgical changes in the left axilla are seen.
IMPRESSION: Pacemaker placement in satisfactory position. No pneumothorax is
seen.

Mild right lower lobe infiltrate.

## 2018-05-21 ENCOUNTER — Other Ambulatory Visit: Payer: Self-pay | Admitting: Cardiology

## 2018-05-29 ENCOUNTER — Other Ambulatory Visit: Payer: Self-pay

## 2018-05-29 NOTE — Telephone Encounter (Signed)
Eliquis 2.5mg  refill request received, pt is 84 yrs old, wt-74.5kg, last seen by Dr. Curt Bears on 09/25/17, and last Crea-1.2 on 04/08/17, staff message sent to Dr. Curt Bears per Jinny Blossom, PharmD

## 2018-06-05 ENCOUNTER — Telehealth: Payer: Self-pay | Admitting: Cardiology

## 2018-06-05 MED ORDER — APIXABAN 5 MG PO TABS: 5.0000 mg | ORAL_TABLET | Freq: Two times a day (BID) | ORAL | 1 refills | Status: AC

## 2018-06-05 NOTE — Telephone Encounter (Addendum)
Attempted to reach patient to explain increased Eliquis dose. No answer/no voicemail. Rx sent to [facility] with 5mg  dosage.  Will try patient again tomorrow to explain increase.

## 2018-06-05 NOTE — Telephone Encounter (Signed)
Pt had Creatinine 07/24/17 1.0 at [place] [facility].

## 2018-06-05 NOTE — Telephone Encounter (Signed)
New Message:   *STAT* If patient is at the pharmacy, call can be transferred to refill team.   1. Which medications need to be refilled? (please list name of each medication and dose if known) apixaban (ELIQUIS) 2.5 MG TABS tablet  2. Which pharmacy/location (including street and city if local pharmacy) is medication to be sent to? [facility] [place], [place]  3. Do they need a 30 day or 90 day supply? 90 Days

## 2018-06-06 NOTE — Telephone Encounter (Signed)
Pt aware of dosage increase.

## 2018-06-25 ENCOUNTER — Ambulatory Visit (INDEPENDENT_AMBULATORY_CARE_PROVIDER_SITE_OTHER): Payer: Medicare Other | Admitting: *Deleted

## 2018-06-25 DIAGNOSIS — I442 Atrioventricular block, complete: Secondary | ICD-10-CM

## 2018-06-25 NOTE — Progress Notes (Signed)
Remote pacemaker transmission.   

## 2018-07-28 LAB — CUP PACEART REMOTE DEVICE CHECK
Battery Remaining Percentage: 95.5 %
Brady Statistic AP VP Percent: 40 %
Brady Statistic AP VS Percent: 1 %
Brady Statistic AS VP Percent: 59 %
Brady Statistic AS VS Percent: 1 %
Brady Statistic RV Percent Paced: 94 %
Implantable Lead Implant Date: 20180226
Implantable Lead Location: 753860
Lead Channel Impedance Value: 440 Ohm
Lead Channel Pacing Threshold Amplitude: 0.75 V
Lead Channel Pacing Threshold Pulse Width: 0.5 ms
Lead Channel Sensing Intrinsic Amplitude: 12 mV
Lead Channel Sensing Intrinsic Amplitude: 5 mV
Lead Channel Setting Pacing Amplitude: 1 V
Lead Channel Setting Pacing Amplitude: 2 V
Lead Channel Setting Pacing Pulse Width: 0.5 ms
Lead Channel Setting Sensing Sensitivity: 2 mV
MDC IDC LEAD IMPLANT DT: 20180226
MDC IDC LEAD LOCATION: 753859
MDC IDC MSMT BATTERY REMAINING LONGEVITY: 119 mo
MDC IDC MSMT BATTERY VOLTAGE: 3.04 V
MDC IDC MSMT LEADCHNL RA PACING THRESHOLD AMPLITUDE: 0.75 V
MDC IDC MSMT LEADCHNL RA PACING THRESHOLD PULSEWIDTH: 0.5 ms
MDC IDC MSMT LEADCHNL RV IMPEDANCE VALUE: 440 Ohm
MDC IDC PG IMPLANT DT: 20180226
MDC IDC SESS DTM: 20190918124624
MDC IDC STAT BRADY RA PERCENT PACED: 26 %
Pulse Gen Model: 2272
Pulse Gen Serial Number: 8002175

## 2018-09-12 ENCOUNTER — Other Ambulatory Visit: Payer: Self-pay | Admitting: Cardiology

## 2018-09-24 ENCOUNTER — Ambulatory Visit (INDEPENDENT_AMBULATORY_CARE_PROVIDER_SITE_OTHER): Payer: Medicare Other

## 2018-09-24 DIAGNOSIS — I442 Atrioventricular block, complete: Secondary | ICD-10-CM

## 2018-09-24 NOTE — Progress Notes (Signed)
Remote pacemaker transmission.   

## 2018-09-25 ENCOUNTER — Encounter: Payer: Self-pay | Admitting: Cardiology

## 2018-10-25 LAB — CUP PACEART REMOTE DEVICE CHECK
Battery Remaining Percentage: 95.5 %
Battery Voltage: 3.04 V
Brady Statistic AP VP Percent: 42 %
Brady Statistic AP VS Percent: 1 %
Brady Statistic RA Percent Paced: 27 %
Brady Statistic RV Percent Paced: 94 %
Implantable Lead Implant Date: 20180226
Implantable Lead Location: 753859
Implantable Pulse Generator Implant Date: 20180226
Lead Channel Impedance Value: 430 Ohm
Lead Channel Pacing Threshold Amplitude: 0.75 V
Lead Channel Pacing Threshold Pulse Width: 0.5 ms
Lead Channel Sensing Intrinsic Amplitude: 5 mV
Lead Channel Setting Pacing Amplitude: 0.875
Lead Channel Setting Sensing Sensitivity: 2 mV
MDC IDC LEAD IMPLANT DT: 20180226
MDC IDC LEAD LOCATION: 753860
MDC IDC MSMT BATTERY REMAINING LONGEVITY: 118 mo
MDC IDC MSMT LEADCHNL RA IMPEDANCE VALUE: 440 Ohm
MDC IDC MSMT LEADCHNL RV PACING THRESHOLD AMPLITUDE: 0.625 V
MDC IDC MSMT LEADCHNL RV PACING THRESHOLD PULSEWIDTH: 0.5 ms
MDC IDC MSMT LEADCHNL RV SENSING INTR AMPL: 12 mV
MDC IDC SESS DTM: 20191218090014
MDC IDC SET LEADCHNL RA PACING AMPLITUDE: 2 V
MDC IDC SET LEADCHNL RV PACING PULSEWIDTH: 0.5 ms
MDC IDC STAT BRADY AS VP PERCENT: 57 %
MDC IDC STAT BRADY AS VS PERCENT: 1 %
Pulse Gen Model: 2272
Pulse Gen Serial Number: 8002175

## 2018-12-24 ENCOUNTER — Ambulatory Visit (INDEPENDENT_AMBULATORY_CARE_PROVIDER_SITE_OTHER): Payer: Medicare Other | Admitting: *Deleted

## 2018-12-24 ENCOUNTER — Other Ambulatory Visit: Payer: Self-pay

## 2018-12-24 DIAGNOSIS — I442 Atrioventricular block, complete: Secondary | ICD-10-CM | POA: Diagnosis not present

## 2018-12-24 LAB — CUP PACEART REMOTE DEVICE CHECK
Battery Remaining Longevity: 120 mo
Battery Remaining Percentage: 95.5 %
Battery Voltage: 3.04 V
Brady Statistic AP VS Percent: 1 %
Brady Statistic AS VP Percent: 57 %
Brady Statistic RA Percent Paced: 21 %
Implantable Lead Implant Date: 20180226
Implantable Lead Location: 753860
Implantable Pulse Generator Implant Date: 20180226
Lead Channel Impedance Value: 360 Ohm
Lead Channel Pacing Threshold Amplitude: 0.75 V
Lead Channel Pacing Threshold Amplitude: 0.75 V
Lead Channel Pacing Threshold Pulse Width: 0.5 ms
Lead Channel Pacing Threshold Pulse Width: 0.5 ms
Lead Channel Sensing Intrinsic Amplitude: 5 mV
Lead Channel Setting Pacing Amplitude: 2 V
Lead Channel Setting Pacing Pulse Width: 0.5 ms
MDC IDC LEAD IMPLANT DT: 20180226
MDC IDC LEAD LOCATION: 753859
MDC IDC MSMT LEADCHNL RV IMPEDANCE VALUE: 440 Ohm
MDC IDC MSMT LEADCHNL RV SENSING INTR AMPL: 12 mV
MDC IDC SESS DTM: 20200318063013
MDC IDC SET LEADCHNL RV PACING AMPLITUDE: 1 V
MDC IDC SET LEADCHNL RV SENSING SENSITIVITY: 2 mV
MDC IDC STAT BRADY AP VP PERCENT: 42 %
MDC IDC STAT BRADY AS VS PERCENT: 1 %
MDC IDC STAT BRADY RV PERCENT PACED: 92 %
Pulse Gen Model: 2272
Pulse Gen Serial Number: 8002175

## 2019-01-01 ENCOUNTER — Encounter: Payer: Self-pay | Admitting: Cardiology

## 2019-01-01 NOTE — Progress Notes (Signed)
Remote pacemaker transmission.   

## 2019-01-05 ENCOUNTER — Telehealth: Payer: Self-pay | Admitting: Cardiology

## 2019-01-05 NOTE — Telephone Encounter (Signed)
Patient moved to [place]. Released in [employer]

## 2021-11-29 ENCOUNTER — Telehealth: Payer: Self-pay | Admitting: Cardiology

## 2021-11-29 NOTE — Telephone Encounter (Signed)
Would like to speak with Dr. Curt Bears in regards to the patient
